# Patient Record
Sex: Female | Born: 1960 | Race: White | Hispanic: No | Marital: Single | Smoking: Current some day smoker
Health system: Southern US, Community
[De-identification: ages and names within clinical notes are randomized; demographics above are authoritative.]

## PROBLEM LIST (undated history)

## (undated) DIAGNOSIS — R200 Anesthesia of skin: Secondary | ICD-10-CM

## (undated) DIAGNOSIS — R350 Frequency of micturition: Secondary | ICD-10-CM

## (undated) DIAGNOSIS — M549 Dorsalgia, unspecified: Secondary | ICD-10-CM

## (undated) DIAGNOSIS — G8929 Other chronic pain: Secondary | ICD-10-CM

## (undated) DIAGNOSIS — M5430 Sciatica, unspecified side: Secondary | ICD-10-CM

## (undated) DIAGNOSIS — Z860100 Personal history of colon polyps, unspecified: Secondary | ICD-10-CM

## (undated) HISTORY — PX: FRACTURE SURGERY: SHX138

## (undated) HISTORY — PX: BACK SURGERY: SHX140

## (undated) SURGERY — COLONOSCOPY
Anesthesia: Moderate Sedation

---

## 2016-05-27 ENCOUNTER — Telehealth: Payer: Self-pay

## 2016-05-27 NOTE — Telephone Encounter (Signed)
[phone]  PATIENT RECEIVED LETTER TO SCHEDULE TCS

## 2016-05-28 ENCOUNTER — Telehealth: Payer: Self-pay

## 2016-06-02 NOTE — Telephone Encounter (Signed)
Gastroenterology Pre-Procedure Review  Request Date: 05/28/2016 Requesting Physician: Abran Richard, MD  PATIENT REVIEW QUESTIONS: The patient responded to the following health history questions as indicated:    1. Diabetes Melitis: no 2. Joint replacements in the past 12 months: no 3. Major health problems in the past 3 months: no 4. Has an artificial valve or MVP: no 5. Has a defibrillator: no 6. Has been advised in past to take antibiotics in advance of a procedure like teeth cleaning: no 7. Family history of colon cancer: no  8. Alcohol Use: no 9. History of sleep apnea: no     MEDICATIONS & ALLERGIES:    Patient reports the following regarding taking any blood thinners:   Plavix? no Aspirin? no Coumadin? no  Patient confirms/reports the following medications:  Current Outpatient Prescriptions  Medication Sig Dispense Refill  . HYDROcodone-acetaminophen (NORCO/VICODIN) 5-325 MG tablet Take 1 tablet by mouth every 6 (six) hours as needed for moderate pain.     No current facility-administered medications for this visit.     Patient confirms/reports the following allergies:  No Known Allergies  No orders of the defined types were placed in this encounter.   AUTHORIZATION INFORMATION Primary Insurance:   ID #:   Group #:  Pre-Cert / Auth required:  Pre-Cert / Auth #:   Secondary Insurance:   ID #:   Group #:  Pre-Cert / Auth required:  Pre-Cert / Auth #:   SCHEDULE INFORMATION: Procedure has been scheduled as follows:  Date: 06/19/2016             Time: 9:15 AM  Location: [facility] Short Stay  This Gastroenterology Pre-Precedure Review Form is being routed to the following provider(s): Barney Drain, MD

## 2016-06-04 ENCOUNTER — Other Ambulatory Visit (HOSPITAL_COMMUNITY): Payer: Self-pay | Admitting: Family

## 2016-06-04 DIAGNOSIS — Z1231 Encounter for screening mammogram for malignant neoplasm of breast: Secondary | ICD-10-CM

## 2016-06-05 NOTE — Telephone Encounter (Signed)
See separate triage.  

## 2016-06-05 NOTE — Telephone Encounter (Signed)
PREPOPIK-DRINK WATER TO KEEP URINE LIGHT YELLOW.  FULL LIQUIDS WITH BREAKFAST.  Full Liquid Diet A high-calorie, high-protein supplement should be used to meet your nutritional requirements when the full liquid diet is continued for more than 2 or 3 days. If this diet is to be used for an extended period of time (more than 7 days), a multivitamin should be considered.  Breads and Starches  Allowed: None are allowed  Avoid: Any others.    Potatoes/Pasta/Rice  Allowed: ANY ITEM AS A SOUP OR SMALL PLATE OF MASHED POTATOES OR SCRAMBLED EGGS.       Vegetables  Allowed: Strained tomato or vegetable juice. Vegetables pureed in soup.   Avoid: Any others.    Fruit  Allowed: Any strained fruit juices and fruit drinks. Include 1 serving of citrus or vitamin C-enriched fruit juice daily.   Avoid: Any others.  Meat and Meat Substitutes  Allowed: Egg  Avoid: Any meat, fish, or fowl. All cheese.  Milk  Allowed: Milk beverages, including milk shakes and instant breakfast mixes. Smooth yogurt.   Avoid: Any others. Avoid dairy products if not tolerated.    Soups and Combination Foods  Allowed: Broth, strained cream soups. Strained, broth-based soups.   Avoid: Any others.    Desserts and Sweets  Allowed: flavored gelatin, ice cream, sherbet, smooth pudding, junket, fruit ices, frozen ice pops, pudding pops, frozen fudge pops, chocolate syrup. Sugar, honey, jelly, syrup.   Avoid: Any others.  Fats and Oils  Allowed: Margarine, butter, cream, sour cream, oils.   Avoid: Any others.  Beverages  Allowed: All.   Avoid: None.  Condiments  Allowed: Iodized salt, pepper, spices, flavorings. Cocoa powder.   Avoid: Any others.    SAMPLE MEAL PLAN Breakfast   cup orange juice.   1 OR 2 EGGS  1 cup milk.   1 cup beverage (coffee or tea).   Cream or sugar, if desired.    Midmorning Snack  2 SCRAMBLED OR HARD BOILED EGG   Lunch  1 cup cream soup.     cup fruit juice.   1 cup milk.    cup custard.   1 cup beverage (coffee or tea).   Cream or sugar, if desired.    Midafternoon Snack  1 cup milk shake.  Dinner  1 cup cream soup.    cup fruit juice.   1 cup MILK    cup pudding.   1 cup beverage (coffee or tea).   Cream or sugar, if desired.  Evening Snack  1 cup supplement.  To increase calories, add sugar, cream, butter, or margarine if possible. Nutritional supplements will also increase the total calories.

## 2016-06-10 ENCOUNTER — Other Ambulatory Visit: Payer: Self-pay

## 2016-06-10 DIAGNOSIS — Z1211 Encounter for screening for malignant neoplasm of colon: Secondary | ICD-10-CM

## 2016-06-10 MED ORDER — PEG 3350-KCL-NA BICARB-NACL 420 G PO SOLR: 4000.0000 mL | ORAL | 0 refills | Status: DC

## 2016-06-10 NOTE — Telephone Encounter (Signed)
Prepopik not covered by insurance. Sending in the [place] and mailing instructions to pt.

## 2016-06-12 ENCOUNTER — Ambulatory Visit (HOSPITAL_COMMUNITY)
Admission: RE | Admit: 2016-06-12 | Discharge: 2016-06-12 | Disposition: A | Discharge status: Home / Self Care | Payer: Medicaid Other | Source: Ambulatory Visit | Attending: Family | Admitting: Family

## 2016-06-12 DIAGNOSIS — Z1231 Encounter for screening mammogram for malignant neoplasm of breast: Secondary | ICD-10-CM | POA: Diagnosis present

## 2016-06-16 NOTE — Telephone Encounter (Signed)
No PA is needed pre online system

## 2016-06-18 ENCOUNTER — Other Ambulatory Visit: Payer: Self-pay

## 2016-06-18 ENCOUNTER — Telehealth: Payer: Self-pay | Admitting: Gastroenterology

## 2016-06-18 DIAGNOSIS — Z1211 Encounter for screening for malignant neoplasm of colon: Secondary | ICD-10-CM

## 2016-06-18 NOTE — Telephone Encounter (Signed)
Kenney Houseman from [facility] called and asked if we could fax patient's Trilyte prescription to them. Patient is scheduled for a colonoscopy tomorrow.

## 2016-06-18 NOTE — Telephone Encounter (Signed)
I called the RX to the pharmacy since it had failed transmission.  Pt is aware.

## 2016-06-19 ENCOUNTER — Ambulatory Visit (HOSPITAL_COMMUNITY)
Admission: RE | Admit: 2016-06-19 | Discharge: 2016-06-19 | Disposition: A | Discharge status: Home / Self Care | Payer: Medicaid Other | Source: Ambulatory Visit | Attending: Gastroenterology | Admitting: Gastroenterology

## 2016-06-19 ENCOUNTER — Encounter (HOSPITAL_COMMUNITY)
Admission: RE | Disposition: A | Discharge status: Home / Self Care | Payer: Self-pay | Source: Ambulatory Visit | Attending: Gastroenterology

## 2016-06-19 ENCOUNTER — Encounter (HOSPITAL_COMMUNITY): Payer: Self-pay | Admitting: *Deleted

## 2016-06-19 DIAGNOSIS — D128 Benign neoplasm of rectum: Secondary | ICD-10-CM

## 2016-06-19 DIAGNOSIS — F17200 Nicotine dependence, unspecified, uncomplicated: Secondary | ICD-10-CM | POA: Insufficient documentation

## 2016-06-19 DIAGNOSIS — K621 Rectal polyp: Secondary | ICD-10-CM | POA: Insufficient documentation

## 2016-06-19 DIAGNOSIS — Z79899 Other long term (current) drug therapy: Secondary | ICD-10-CM

## 2016-06-19 DIAGNOSIS — K648 Other hemorrhoids: Secondary | ICD-10-CM | POA: Diagnosis not present

## 2016-06-19 DIAGNOSIS — Q438 Other specified congenital malformations of intestine: Secondary | ICD-10-CM | POA: Insufficient documentation

## 2016-06-19 DIAGNOSIS — Z1211 Encounter for screening for malignant neoplasm of colon: Secondary | ICD-10-CM | POA: Diagnosis present

## 2016-06-19 HISTORY — PX: COLONOSCOPY: SHX5424

## 2016-06-19 SURGERY — COLONOSCOPY
Anesthesia: Moderate Sedation

## 2016-06-19 MED ORDER — MEPERIDINE HCL 100 MG/ML IJ SOLN
INTRAMUSCULAR | Status: DC | PRN
  Administered 2016-06-19 (×2): 25 mg via INTRAVENOUS

## 2016-06-19 MED ORDER — SODIUM CHLORIDE 0.9 % IV SOLN
INTRAVENOUS | Status: DC
  Administered 2016-06-19: 09:00:00 via INTRAVENOUS

## 2016-06-19 MED ORDER — MIDAZOLAM HCL 5 MG/5ML IJ SOLN
INTRAMUSCULAR | Status: AC
  Filled 2016-06-19: qty 10

## 2016-06-19 MED ORDER — MIDAZOLAM HCL 5 MG/5ML IJ SOLN
INTRAMUSCULAR | Status: DC | PRN
  Administered 2016-06-19: 1 mg via INTRAVENOUS
  Administered 2016-06-19 (×2): 2 mg via INTRAVENOUS

## 2016-06-19 MED ORDER — MEPERIDINE HCL 100 MG/ML IJ SOLN
INTRAMUSCULAR | Status: DC
Start: 2016-06-19 — End: 2016-06-19
  Filled 2016-06-19: qty 2

## 2016-06-19 NOTE — H&P (Signed)
  Primary Care Physician:  Joyice Faster, FNP Primary Gastroenterologist:  Dr. Oneida Alar  Pre-Procedure History & Physical: HPI:  Dana Goodwin is a 55 y.o. female here for [place].  History reviewed. No pertinent past medical history.  Past Surgical History:  Procedure Laterality Date  . BACK SURGERY    . FRACTURE SURGERY Left    left leg has rod and pins    Prior to Admission medications   Medication Sig Start Date End Date Taking? Authorizing Provider  HYDROcodone-acetaminophen (NORCO/VICODIN) 5-325 MG tablet Take 1 tablet by mouth every 6 (six) hours as needed for moderate pain.   Yes Historical Provider, MD  polyethylene glycol-electrolytes (TRILYTE) 420 g solution Take 4,000 mLs by mouth as directed. 06/10/16  Yes Danie Binder, MD    Allergies as of 06/10/2016  . (No Known Allergies)    Family History  Problem Relation Age of Onset  . Heart attack Mother   . Prostate cancer Father   . Heart attack Brother   . Diabetes Brother     Social History   Social History  . Marital status: Single    Spouse name: N/A  . Number of children: N/A  . Years of education: N/A   Occupational History  . Not on file.   Social History Main Topics  . Smoking status: Current Every Day Smoker    Packs/day: 0.50    Years: 37.00  . Smokeless tobacco: Never Used  . Alcohol use No  . Drug use: No  . Sexual activity: Not on file   Other Topics Concern  . Not on file   Social History Narrative  . No narrative on file    Review of Systems: See HPI, otherwise negative ROS   Physical Exam: BP 121/67   Pulse (!) 58   Temp 98.3 F (36.8 C) (Oral)   Resp 20   Ht 5' 2.5" (1.588 m)   Wt 115 lb (52.2 kg)   SpO2 100%   BMI 20.70 kg/m  General:   Alert,  pleasant and cooperative in NAD Head:  Normocephalic and atraumatic. Neck:  Supple; Lungs:  Clear throughout to auscultation.    Heart:  Regular rate and rhythm. Abdomen:  Soft, nontender and nondistended.  Normal bowel sounds, without guarding, and without rebound.   Neurologic:  Alert and  oriented x4;  grossly normal neurologically.  Impression/Plan:     SCREENING  Plan:  1. TCS TODAY

## 2016-06-19 NOTE — Discharge Instructions (Signed)
You had 2 small polyps removed FROM YOUR RECTUM. You have internal hemorrhoids.   DRINK WATER TO KEEP YOUR URINE LIGHT YELLOW.  FOLLOW A HIGH FIBER DIET. AVOID ITEMS THAT CAUSE BLOATING & GAS. SEE INFO BELOW.  YOUR BIOPSY RESULTS WILL BE AVAILABLE IN MY CHART AFTER   SEP 19 AND MY OFFICE WILL CONTACT YOU IN 10-14 DAYS WITH YOUR RESULTS.   Next colonoscopy in 5-10 years.    Colonoscopy Care After Read the instructions outlined below and refer to this sheet in the next week. These discharge instructions provide you with general information on caring for yourself after you leave the hospital. While your treatment has been planned according to the most current medical practices available, unavoidable complications occasionally occur. If you have any problems or questions after discharge, call DR. Najee Manninen, [phone].  ACTIVITY  You may resume your regular activity, but move at a slower pace for the next 24 hours.   Take frequent rest periods for the next 24 hours.   Walking will help get rid of the air and reduce the bloated feeling in your belly (abdomen).   No driving for 24 hours (because of the medicine (anesthesia) used during the test).   You may shower.   Do not sign any important legal documents or operate any machinery for 24 hours (because of the anesthesia used during the test).    NUTRITION  Drink plenty of fluids.   You may resume your normal diet as instructed by your doctor.   Begin with a light meal and progress to your normal diet. Heavy or fried foods are harder to digest and may make you feel sick to your stomach (nauseated).   Avoid alcoholic beverages for 24 hours or as instructed.    MEDICATIONS  You may resume your normal medications.   WHAT YOU CAN EXPECT TODAY  Some feelings of bloating in the abdomen.   Passage of more gas than usual.   Spotting of blood in your stool or on the toilet paper  .  IF YOU HAD POLYPS REMOVED DURING THE  COLONOSCOPY:  Eat a soft diet IF YOU HAVE NAUSEA, BLOATING, ABDOMINAL PAIN, OR VOMITING.    FINDING OUT THE RESULTS OF YOUR TEST Not all test results are available during your visit. DR. Oneida Alar WILL CALL YOU WITHIN 14 DAYS OF YOUR PROCEDUE WITH YOUR RESULTS. Do not assume everything is normal if you have not heard from DR. Bernetta Sutley, CALL HER OFFICE AT [phone].  SEEK IMMEDIATE MEDICAL ATTENTION AND CALL THE OFFICE: [phone] IF:  You have more than a spotting of blood in your stool.   Your belly is swollen (abdominal distention).   You are nauseated or vomiting.   You have a temperature over 101F.   You have abdominal pain or discomfort that is severe or gets worse throughout the day.   High-Fiber Diet A high-fiber diet changes your normal diet to include more whole grains, legumes, fruits, and vegetables. Changes in the diet involve replacing refined carbohydrates with unrefined foods. The calorie level of the diet is essentially unchanged. The Dietary Reference Intake (recommended amount) for adult males is 38 grams per day. For adult females, it is 25 grams per day. Pregnant and lactating women should consume 28 grams of fiber per day. Fiber is the intact part of a plant that is not broken down during digestion. Functional fiber is fiber that has been isolated from the plant to provide a beneficial effect in the body. PURPOSE  Increase  stool bulk.   Ease and regulate bowel movements.   Lower cholesterol.   REDUCE RISK OF COLON CANCER  INDICATIONS THAT YOU NEED MORE FIBER  Constipation and hemorrhoids.   Uncomplicated diverticulosis (intestine condition) and irritable bowel syndrome.   Weight management.   As a protective measure against hardening of the arteries (atherosclerosis), diabetes, and cancer.   GUIDELINES FOR INCREASING FIBER IN THE DIET  Start adding fiber to the diet slowly. A gradual increase of about 5 more grams (2 slices of whole-wheat bread, 2  servings of most fruits or vegetables, or 1 bowl of high-fiber cereal) per day is best. Too rapid an increase in fiber may result in constipation, flatulence, and bloating.   Drink enough water and fluids to keep your urine clear or pale yellow. Water, juice, or caffeine-free drinks are recommended. Not drinking enough fluid may cause constipation.   Eat a variety of high-fiber foods rather than one type of fiber.   Try to increase your intake of fiber through using high-fiber foods rather than fiber pills or supplements that contain small amounts of fiber.   The goal is to change the types of food eaten. Do not supplement your present diet with high-fiber foods, but replace foods in your present diet.   INCLUDE A VARIETY OF FIBER SOURCES  Replace refined and processed grains with whole grains, canned fruits with fresh fruits, and incorporate other fiber sources. White rice, white breads, and most bakery goods contain little or no fiber.   Brown whole-grain rice, buckwheat oats, and many fruits and vegetables are all good sources of fiber. These include: broccoli, Brussels sprouts, cabbage, cauliflower, beets, sweet potatoes, white potatoes (skin on), carrots, tomatoes, eggplant, squash, berries, fresh fruits, and dried fruits.   Cereals appear to be the richest source of fiber. Cereal fiber is found in whole grains and bran. Bran is the fiber-rich outer coat of cereal grain, which is largely removed in refining. In whole-grain cereals, the bran remains. In breakfast cereals, the largest amount of fiber is found in those with "bran" in their names. The fiber content is sometimes indicated on the label.   You may need to include additional fruits and vegetables each day.   In baking, for 1 cup white flour, you may use the following substitutions:   1 cup whole-wheat flour minus 2 tablespoons.   1/2 cup white flour plus 1/2 cup whole-wheat flour.   Polyps, Colon  A polyp is extra tissue that  grows inside your body. Colon polyps grow in the large intestine. The large intestine, also called the colon, is part of your digestive system. It is a long, hollow tube at the end of your digestive tract where your body makes and stores stool. Most polyps are not dangerous. They are benign. This means they are not cancerous. But over time, some types of polyps can turn into cancer. Polyps that are smaller than a pea are usually not harmful. But larger polyps could someday become or may already be cancerous. To be safe, doctors remove all polyps and test them.   PREVENTION There is not one sure way to prevent polyps. You might be able to lower your risk of getting them if you:  Eat more fruits and vegetables and less fatty food.   Do not smoke.   Avoid alcohol.   Exercise every day.   Lose weight if you are overweight.   Eating more calcium and folate can also lower your risk of getting polyps. Some  foods that are rich in calcium are milk, cheese, and broccoli. Some foods that are rich in folate are chickpeas, kidney beans, and spinach.   Hemorrhoids Hemorrhoids are dilated (enlarged) veins around the rectum. Sometimes clots will form in the veins. This makes them swollen and painful. These are called thrombosed hemorrhoids. Causes of hemorrhoids include:  Constipation.   Straining to have a bowel movement.   HEAVY LIFTING  HOME CARE INSTRUCTIONS  Eat a well balanced diet and drink 6 to 8 glasses of water every day to avoid constipation. You may also use a bulk laxative.   Avoid straining to have bowel movements.   Keep anal area dry and clean.   Do not use a donut shaped pillow or sit on the toilet for long periods. This increases blood pooling and pain.   Move your bowels when your body has the urge; this will require less straining and will decrease pain and pressure.

## 2016-06-19 NOTE — Op Note (Signed)
[facility] Patient Name: Dana Goodwin Procedure Date: 06/19/2016 9:25 AM MRN: [id number] Date of Birth: [birth year] Attending MD: Barney Drain , MD CSN: [id number] Age: 55 Admit Type: Outpatient Procedure:                Colonoscopy WITH COLD FORCEPS POLYPECTOMY Indications:              Screening for colorectal malignant neoplasm Providers:                Barney Drain, MD, Janeece Riggers, RN, Isabella Stalling,                            Technician Referring MD:             Joyice Faster Medicines:                Meperidine 50 mg IV, Midazolam 5 mg IV Complications:            No immediate complications. Estimated Blood Loss:     Estimated blood loss was minimal. Procedure:                Pre-Anesthesia Assessment:                           - Prior to the procedure, a History and Physical                            was performed, and patient medications and                            allergies were reviewed. The patient's tolerance of                            previous anesthesia was also reviewed. The risks                            and benefits of the procedure and the sedation                            options and risks were discussed with the patient.                            All questions were answered, and informed consent                            was obtained. Prior Anticoagulants: The patient has                            taken no previous anticoagulant or antiplatelet                            agents. ASA Grade Assessment: I - A normal, healthy                            patient. After reviewing the risks and benefits,  the patient was deemed in satisfactory condition to                            undergo the procedure. After obtaining informed                            consent, the colonoscope was passed under direct                            vision. Throughout the procedure, the patient's                            blood pressure,  pulse, and oxygen saturations were                            monitored continuously. The [id number]                            scope was introduced through the anus and advanced                            to the the cecum, identified by appendiceal orifice                            and ileocecal valve. The ileocecal valve,                            appendiceal orifice, and rectum were photographed.                            The colonoscopy was somewhat difficult due to a                            tortuous colon. Successful completion of the                            procedure was aided by COLOWRAP. The patient                            tolerated the procedure well. The quality of the                            bowel preparation was excellent. Scope In: 9:47:13 AM Scope Out: 10:03:15 AM Scope Withdrawal Time: 0 hours 11 minutes 29 seconds  Total Procedure Duration: 0 hours 16 minutes 2 seconds  Findings:      The digital rectal exam was normal.      Two sessile polyps were found in the rectum. The polyps were 2 to 5 mm       in size. These polyps were removed with a cold biopsy forceps. Resection       and retrieval were complete.      The recto-sigmoid colon and sigmoid colon were mildly redundant. AND       COLOWRAP      Non-bleeding internal hemorrhoids were found. The  hemorrhoids were       moderate. Impression:               - Two 2 to 5 mm polyps in the rectum, removed with                            a cold biopsy forceps. .                           - Redundant colon.                           - Non-bleeding internal hemorrhoids. Moderate Sedation:      Moderate (conscious) sedation was administered by the endoscopy nurse       and supervised by the endoscopist. The following parameters were       monitored: oxygen saturation, heart rate, blood pressure, and response       to care. Total physician intraservice time was 34 minutes. Recommendation:           -  High fiber diet.                           - Continue present medications.                           - Await pathology results.                           - Repeat colonoscopy in 5-10 years for surveillance.                           - Patient has a contact number available for                            emergencies. The signs and symptoms of potential                            delayed complications were discussed with the                            patient. Return to normal activities tomorrow.                            Written discharge instructions were provided to the                            patient. Procedure Code(s):        --- Professional ---                           [phone], Colonoscopy, flexible; with biopsy, single                            or multiple                           99152, Moderate sedation services provided by the  same physician or other qualified health care                            professional performing the diagnostic or                            therapeutic service that the sedation supports,                            requiring the presence of an independent trained                            observer to assist in the monitoring of the                            patient's level of consciousness and physiological                            status; initial 15 minutes of intraservice time,                            patient age 77 years or older                           [phone], Moderate sedation services; each additional                            15 minutes intraservice time Diagnosis Code(s):        --- Professional ---                           Z12.11, Encounter for screening for malignant                            neoplasm of colon                           K62.1, Rectal polyp                           K64.8, Other hemorrhoids                           Q43.8, Other specified congenital malformations of                             intestine CPT copyright 2016 [employer]. All rights reserved. The codes documented in this report are preliminary and upon coder review may  be revised to meet current compliance requirements. Barney Drain, MD Barney Drain, MD 06/19/2016 10:19:05 AM This report has been signed electronically. Number of Addenda: 0

## 2016-06-22 ENCOUNTER — Telehealth: Payer: Self-pay | Admitting: Gastroenterology

## 2016-06-22 NOTE — Telephone Encounter (Signed)
Please call pt. She had HYPERPLASTIC POLYPS removed.    DRINK WATER TO KEEP YOUR URINE LIGHT YELLOW.  FOLLOW A HIGH FIBER DIET. AVOID ITEMS THAT CAUSE BLOATING & GAS.   Next colonoscopy in 10 years.

## 2016-06-23 NOTE — Telephone Encounter (Signed)
Pt called and was informed.  

## 2016-06-23 NOTE — Telephone Encounter (Signed)
Tried to call, VM not set up. Mailing a letter to call.

## 2016-06-23 NOTE — Telephone Encounter (Signed)
Reminder in epic °

## 2016-06-29 ENCOUNTER — Encounter (HOSPITAL_COMMUNITY): Payer: Self-pay | Admitting: Gastroenterology

## 2016-07-20 ENCOUNTER — Ambulatory Visit: Payer: Medicaid Other | Admitting: Orthopedic Surgery

## 2016-07-21 ENCOUNTER — Ambulatory Visit (INDEPENDENT_AMBULATORY_CARE_PROVIDER_SITE_OTHER): Payer: Medicaid Other | Admitting: Orthopedic Surgery

## 2016-07-21 ENCOUNTER — Encounter: Payer: Self-pay | Admitting: Orthopedic Surgery

## 2016-07-21 ENCOUNTER — Ambulatory Visit (INDEPENDENT_AMBULATORY_CARE_PROVIDER_SITE_OTHER): Payer: Medicaid Other

## 2016-07-21 VITALS — BP 145/94 | HR 67 | Ht 63.0 in | Wt 115.0 lb

## 2016-07-21 DIAGNOSIS — M5442 Lumbago with sciatica, left side: Secondary | ICD-10-CM

## 2016-07-21 DIAGNOSIS — M5126 Other intervertebral disc displacement, lumbar region: Secondary | ICD-10-CM | POA: Diagnosis not present

## 2016-07-21 DIAGNOSIS — G8929 Other chronic pain: Secondary | ICD-10-CM

## 2016-07-21 MED ORDER — CYCLOBENZAPRINE HCL 10 MG PO TABS: 10.0000 mg | ORAL_TABLET | Freq: Two times a day (BID) | ORAL | 1 refills | Status: DC | PRN

## 2016-07-21 MED ORDER — METHYLPREDNISOLONE ACETATE 40 MG/ML IJ SUSP
40.0000 mg | Freq: Once | INTRAMUSCULAR | Status: AC
  Administered 2016-07-21: 40 mg via INTRAMUSCULAR

## 2016-07-21 MED ORDER — GABAPENTIN 100 MG PO CAPS: 100.0000 mg | ORAL_CAPSULE | Freq: Three times a day (TID) | ORAL | 2 refills | Status: DC

## 2016-07-21 NOTE — Patient Instructions (Addendum)
MRI  Continue motrin   Meds ordered this encounter  Medications  . cyclobenzaprine (FLEXERIL) 10 MG tablet    Sig: Take 1 tablet (10 mg total) by mouth 2 (two) times daily as needed for muscle spasms.    Dispense:  40 tablet    Refill:  1  . gabapentin (NEURONTIN) 100 MG capsule    Sig: Take 1 capsule (100 mg total) by mouth 3 (three) times daily.    Dispense:  90 capsule    Refill:  2

## 2016-07-21 NOTE — Progress Notes (Signed)
Chief Complaint  Patient presents with  . Back Pain    LOW BACK PAIN   HPI   55 year old female status post 2 laminectomies back in the 90s by Dr. Primitivo Gauze presents with recurrent lower back pain for the last 3 months worsening over the last month. Presents with constant sharp aching lower back pain rated 8 out of 10 associated with locking and stiffness of the lumbar spine and numbness and tingling in her left leg and weakness in the left foot  She did try Motrin and some hydrocodone without relief her symptoms are worse when sitting.  Review of Systems  Musculoskeletal:       Joint pain limb pain stiffness joints swollen joints back pain  Neurological:       Numbness and tingling and burning pain in the left leg  All other systems reviewed and are negative.   Medical history of pneumonia but no medical problems of hypertension liver disease heart disease  Past Surgical History:  Procedure Laterality Date  . BACK SURGERY    . COLONOSCOPY N/A 06/19/2016   Procedure: COLONOSCOPY;  Surgeon: Danie Binder, MD;  [facility];  Service: Endoscopy;  Laterality: N/A;  9:15 Am  . FRACTURE SURGERY Left    left leg has rod and pins   Family History  Problem Relation Age of Onset  . Heart attack Mother   . Prostate cancer Father   . Heart attack Brother   . Diabetes Brother    Social History  Substance Use Topics  . Smoking status: Current Every Day Smoker    Packs/day: 0.50    Years: 37.00  . Smokeless tobacco: Never Used  . Alcohol use No   No outpatient prescriptions have been marked as taking for the 07/21/16 encounter (Office Visit) with Carole Civil, MD.    BP (!) 145/94   Pulse 67   Ht 5\' 3"  (1.6 m)   Wt 115 lb (52.2 kg)   BMI 20.37 kg/m   Physical Exam  Constitutional: She is oriented to person, place, and time. She appears well-developed and well-nourished. No distress.  Cardiovascular: Normal rate and intact distal pulses.   Neurological: She is  alert and oriented to person, place, and time. She has normal reflexes. She exhibits normal muscle tone. Coordination normal.  Skin: Skin is warm and dry. No rash noted. She is not diaphoretic. No erythema. No pallor.  Psychiatric: She has a normal mood and affect. Her behavior is normal. Judgment and thought content normal.    Ortho Exam Right lower extremity inspection is normal palpation no tenderness hip knee and ankle full range of motion all joints stable muscle tone and strength normal skin normal pulses good no edema normal sensation normal reflexes negative straight leg raise on the right  Tenderness none lumbar spine previous incision well healed no tenderness no erythema tenderness at L4-5 L5-S1 left side and left buttock. She has a positive femoral nerve stretch test a positive straight leg raise on the left at 50 she has weakness of the toe extensors as well as the foot dorsiflexor. No instability is noted in the leg skin is otherwise normal she has an old rod in the tibia with a cyst formed previously worked up without abnormality. Pulses normal and sensation is altered in the L4 region    ASSESSMENT: My personal interpretation of the images:  Plain films show degenerative disc disease at L4-5 and L5-S1 as well as facet joints consistent with previous  surgery    PLAN MRI Intramuscular 40 mg Depo-Medrol Muscle relaxer and gabapentin Follow-up after MRI  continue ibuprofen   Arther Abbott, MD 07/21/2016 9:47 AM  .meds

## 2016-07-21 NOTE — Addendum Note (Signed)
Addended by: Baldomero Lamy B on: 07/21/2016 11:42 AM   Modules accepted: Orders

## 2016-07-21 NOTE — Addendum Note (Signed)
Addended by: Baldomero Lamy B on: 07/21/2016 10:31 AM   Modules accepted: Orders

## 2016-07-31 ENCOUNTER — Ambulatory Visit (HOSPITAL_COMMUNITY)
Admission: RE | Admit: 2016-07-31 | Discharge: 2016-07-31 | Disposition: A | Discharge status: Home / Self Care | Payer: Medicaid Other | Source: Ambulatory Visit | Attending: Orthopedic Surgery | Admitting: Orthopedic Surgery

## 2016-07-31 DIAGNOSIS — M5126 Other intervertebral disc displacement, lumbar region: Secondary | ICD-10-CM | POA: Diagnosis present

## 2016-07-31 DIAGNOSIS — M48061 Spinal stenosis, lumbar region without neurogenic claudication: Secondary | ICD-10-CM | POA: Diagnosis not present

## 2016-07-31 DIAGNOSIS — M5136 Other intervertebral disc degeneration, lumbar region: Secondary | ICD-10-CM | POA: Diagnosis not present

## 2016-08-12 ENCOUNTER — Ambulatory Visit (INDEPENDENT_AMBULATORY_CARE_PROVIDER_SITE_OTHER): Payer: Medicaid Other | Admitting: Orthopedic Surgery

## 2016-08-12 ENCOUNTER — Encounter: Payer: Self-pay | Admitting: Orthopedic Surgery

## 2016-08-12 DIAGNOSIS — M5126 Other intervertebral disc displacement, lumbar region: Secondary | ICD-10-CM | POA: Diagnosis not present

## 2016-08-12 MED ORDER — HYDROCODONE-ACETAMINOPHEN 5-325 MG PO TABS: 1.0000 | ORAL_TABLET | Freq: Three times a day (TID) | ORAL | 0 refills | Status: DC | PRN

## 2016-08-12 NOTE — Progress Notes (Signed)
Chief Complaint  Patient presents with  . Follow-up    MRI REVIEW L SPINE   HPI Chief Complaint  Patient presents with  . Back Pain      LOW BACK PAIN    HPI As taken on the last visit   55 year old female status post 2 laminectomies back in the 90s by Dr. Primitivo Gauze presents with recurrent lower back pain for the last 3 months worsening over the last month. Presents with constant sharp aching lower back pain rated 8 out of 10 associated with locking and stiffness of the lumbar spine and numbness and tingling in her left leg and weakness in the left foot   She did try Motrin and some hydrocodone without relief her symptoms are worse when sitting.  Review of Systems  Musculoskeletal:       Joint pain limb pain stiffness joints swollen joints back pain  Neurological:       Numbness and tingling and burning pain in the left leg  All other systems reviewed and are negative.   No past medical history on file.  Past Surgical History:  Procedure Laterality Date  . BACK SURGERY    . COLONOSCOPY N/A 06/19/2016   Procedure: COLONOSCOPY;  Surgeon: Danie Binder, MD;  [facility];  Service: Endoscopy;  Laterality: N/A;  9:15 Am  . FRACTURE SURGERY Left    left leg has rod and pins   Family History  Problem Relation Age of Onset  . Heart attack Mother   . Prostate cancer Father   . Heart attack Brother   . Diabetes Brother    Social History  Substance Use Topics  . Smoking status: Current Every Day Smoker    Packs/day: 0.50    Years: 37.00  . Smokeless tobacco: Never Used  . Alcohol use No   Current Meds  Medication Sig  . cyclobenzaprine (FLEXERIL) 10 MG tablet Take 1 tablet (10 mg total) by mouth 2 (two) times daily as needed for muscle spasms.  Marland Kitchen gabapentin (NEURONTIN) 100 MG capsule Take 1 capsule (100 mg total) by mouth 3 (three) times daily.    There were no vitals taken for this visit.  Physical Exam  Ortho Exam   ASSESSMENT: My personal  interpretation of the images:  L3-4 disc protrusion with extruded fragment and L4 nerve root impingement  Previous disc changes consistent with laminectomy at L4-5  Meds ordered this encounter  Medications  . HYDROcodone-acetaminophen (NORCO/VICODIN) 5-325 MG tablet    Sig: Take 1 tablet by mouth every 8 (eight) hours as needed for moderate pain.    Dispense:  90 tablet    Refill:  0     PLAN Referral to neurosurgery     Arther Abbott, MD 08/12/2016 11:00 AM  .meds

## 2016-08-17 ENCOUNTER — Telehealth: Payer: Self-pay | Admitting: Orthopedic Surgery

## 2016-08-17 ENCOUNTER — Other Ambulatory Visit: Payer: Self-pay | Admitting: *Deleted

## 2016-08-17 MED ORDER — HYDROCODONE-ACETAMINOPHEN 5-325 MG PO TABS: 1.0000 | ORAL_TABLET | Freq: Three times a day (TID) | ORAL | 0 refills | Status: DC | PRN

## 2016-08-17 NOTE — Telephone Encounter (Signed)
Called back / Relayed to patient.

## 2016-08-17 NOTE — Telephone Encounter (Signed)
Advise new script available for pick up, medicaid will only pay for 14 day supply per month

## 2016-08-17 NOTE — Telephone Encounter (Signed)
Patient called to check status of prior authorization of Hydrocodone prescription, which patient picked up as noted 08/12/16; states [facility] awaiting pre-authorization required for [facility]; said otherwise patient will need to pay for the medication.

## 2016-08-25 ENCOUNTER — Other Ambulatory Visit: Payer: Self-pay | Admitting: Orthopedic Surgery

## 2016-08-25 DIAGNOSIS — M5126 Other intervertebral disc displacement, lumbar region: Secondary | ICD-10-CM

## 2016-12-10 ENCOUNTER — Other Ambulatory Visit: Payer: Self-pay | Admitting: Neurosurgery

## 2016-12-22 NOTE — Pre-Procedure Instructions (Signed)
Dana Goodwin  12/22/2016      [place], [place] - [address] [place] [address] Phone: [phone] Fax: [phone]    Your procedure is scheduled on Wed, Mar 28 @ 8:00 AM  Report to [facility] Admitting at 6:00 AM  Call this number if you have problems the morning of surgery:  [phone]   Remember:  Do not eat food or drink liquids after midnight.  Take these medicines the morning of surgery with A SIP OF WATER Pain Pill(if needed)              No Goody's,BC's,Aleve,Advil,Motrin,Ibuprofen,Fish Oil,or any Herbal Medications.    Do not wear jewelry, make-up or nail polish.  Do not wear lotions, powders, perfumes, or deoderant.  Do not shave 48 hours prior to surgery.   Do not bring valuables to the hospital.  [facility] is not responsible for any belongings or valuables.  Contacts, dentures or bridgework may not be worn into surgery.  Leave your suitcase in the car.  After surgery it may be brought to your room.  For patients admitted to the hospital, discharge time will be determined by your treatment team.  Patients discharged the day of surgery will not be allowed to drive home.   [facility]  Before surgery, you can play an important role.  Because skin is not sterile, your skin needs to be as free of germs as possible.  You can reduce the number of germs on you skin by washing with CHG (chlorahexidine gluconate) soap before surgery.  CHG is an antiseptic cleaner which kills germs and bonds with the skin to continue killing germs even after washing.  Please DO NOT use if you have an allergy to CHG or antibacterial soaps.  If your skin becomes reddened/irritated stop using the CHG and inform your nurse when you arrive at Short Stay.  Do not shave (including legs and underarms) for at least 48 hours prior to the first CHG shower.  You may shave your  face.  Please follow these instructions carefully:   1.  Shower with CHG Soap the night before surgery and the                                morning of Surgery.  2.  If you choose to wash your hair, wash your hair first as usual with your       normal shampoo.  3.  After you shampoo, rinse your hair and body thoroughly to remove the                      Shampoo.  4.  Use CHG as you would any other liquid soap.  You can apply chg directly       to the skin and wash gently with scrungie or a clean washcloth.  5.  Apply the CHG Soap to your body ONLY FROM THE NECK DOWN.        Do not use on open wounds or open sores.  Avoid contact with your eyes,       ears, mouth and genitals (private parts).  Wash genitals (private parts)       with your normal soap.  6.  Wash thoroughly, paying special attention to the area where your surgery  will be performed.  7.  Thoroughly rinse your body with warm water from the neck down.  8.  DO NOT shower/wash with your normal soap after using and rinsing off       the CHG Soap.  9.  Pat yourself dry with a clean towel.            10.  Wear clean pajamas.            11.  Place clean sheets on your bed the night of your first shower and do not        sleep with pets.  Day of Surgery  Do not apply any lotions/deoderants the morning of surgery.  Please wear clean clothes to the hospital/surgery center.    Please read over the following fact sheets that you were given. Pain Booklet, Coughing and Deep Breathing, MRSA Information and Surgical Site Infection Prevention

## 2016-12-23 ENCOUNTER — Encounter (HOSPITAL_COMMUNITY)
Admission: RE | Admit: 2016-12-23 | Discharge: 2016-12-23 | Disposition: A | Discharge status: Home / Self Care | Payer: Medicaid Other | Source: Ambulatory Visit | Attending: Neurosurgery | Admitting: Neurosurgery

## 2016-12-23 ENCOUNTER — Encounter (HOSPITAL_COMMUNITY): Payer: Self-pay

## 2016-12-23 DIAGNOSIS — M5126 Other intervertebral disc displacement, lumbar region: Secondary | ICD-10-CM | POA: Insufficient documentation

## 2016-12-23 DIAGNOSIS — Z01812 Encounter for preprocedural laboratory examination: Secondary | ICD-10-CM | POA: Diagnosis not present

## 2016-12-23 HISTORY — DX: Personal history of colon polyps, unspecified: Z86.0100

## 2016-12-23 HISTORY — DX: Personal history of colonic polyps: Z86.010

## 2016-12-23 HISTORY — DX: Anesthesia of skin: R20.0

## 2016-12-23 HISTORY — DX: Frequency of micturition: R35.0

## 2016-12-23 LAB — CBC WITH DIFFERENTIAL/PLATELET
Basophils Absolute: 0.1 10*3/uL (ref 0.0–0.1)
Basophils Relative: 1 %
Eosinophils Absolute: 0.2 10*3/uL (ref 0.0–0.7)
Eosinophils Relative: 5 %
HCT: 42.6 % (ref 36.0–46.0)
Hemoglobin: 14.3 g/dL (ref 12.0–15.0)
Lymphocytes Relative: 41 %
Lymphs Abs: 1.8 10*3/uL (ref 0.7–4.0)
MCH: 30.4 pg (ref 26.0–34.0)
MCHC: 33.6 g/dL (ref 30.0–36.0)
MCV: 90.6 fL (ref 78.0–100.0)
Monocytes Absolute: 0.4 10*3/uL (ref 0.1–1.0)
Monocytes Relative: 9 %
Neutro Abs: 1.9 10*3/uL (ref 1.7–7.7)
Neutrophils Relative %: 44 %
Platelets: 265 10*3/uL (ref 150–400)
RBC: 4.7 MIL/uL (ref 3.87–5.11)
RDW: 13.3 % (ref 11.5–15.5)
WBC: 4.5 10*3/uL (ref 4.0–10.5)

## 2016-12-23 LAB — SURGICAL PCR SCREEN
MRSA, PCR: NEGATIVE
Staphylococcus aureus: NEGATIVE

## 2016-12-23 LAB — ABO/RH: ABO/RH(D): O POS

## 2016-12-23 LAB — TYPE AND SCREEN
ABO/RH(D): O POS
Antibody Screen: NEGATIVE

## 2016-12-23 MED ORDER — CHLORHEXIDINE GLUCONATE CLOTH 2 % EX PADS: 6.0000 | MEDICATED_PAD | Freq: Once | CUTANEOUS | Status: DC

## 2016-12-23 NOTE — Progress Notes (Addendum)
Medical Md is Dr.Hunter with [facility]  Cardiologist denies  Echo denies  Stress test denies  Heart cath denies  EKG denies in past yr  CXR denies in past yr

## 2016-12-30 ENCOUNTER — Ambulatory Visit (HOSPITAL_COMMUNITY): Payer: Medicaid Other | Admitting: Anesthesiology

## 2016-12-30 ENCOUNTER — Observation Stay (HOSPITAL_COMMUNITY)
Admission: RE | Admit: 2016-12-30 | Discharge: 2016-12-30 | Disposition: A | Discharge status: Home / Self Care | Payer: Medicaid Other | Source: Ambulatory Visit | Attending: Neurosurgery | Admitting: Neurosurgery

## 2016-12-30 ENCOUNTER — Encounter (HOSPITAL_COMMUNITY): Payer: Self-pay | Admitting: Urology

## 2016-12-30 ENCOUNTER — Ambulatory Visit (HOSPITAL_COMMUNITY): Payer: Medicaid Other

## 2016-12-30 ENCOUNTER — Encounter (HOSPITAL_COMMUNITY)
Admission: RE | Disposition: A | Discharge status: Home / Self Care | Payer: Self-pay | Source: Ambulatory Visit | Attending: Neurosurgery

## 2016-12-30 DIAGNOSIS — M5126 Other intervertebral disc displacement, lumbar region: Secondary | ICD-10-CM | POA: Diagnosis present

## 2016-12-30 DIAGNOSIS — Z86010 Personal history of colonic polyps: Secondary | ICD-10-CM | POA: Diagnosis not present

## 2016-12-30 DIAGNOSIS — Z79899 Other long term (current) drug therapy: Secondary | ICD-10-CM | POA: Insufficient documentation

## 2016-12-30 DIAGNOSIS — Z8249 Family history of ischemic heart disease and other diseases of the circulatory system: Secondary | ICD-10-CM | POA: Insufficient documentation

## 2016-12-30 DIAGNOSIS — F17200 Nicotine dependence, unspecified, uncomplicated: Secondary | ICD-10-CM | POA: Diagnosis not present

## 2016-12-30 DIAGNOSIS — Z8042 Family history of malignant neoplasm of prostate: Secondary | ICD-10-CM | POA: Diagnosis not present

## 2016-12-30 DIAGNOSIS — R350 Frequency of micturition: Secondary | ICD-10-CM | POA: Insufficient documentation

## 2016-12-30 DIAGNOSIS — M5116 Intervertebral disc disorders with radiculopathy, lumbar region: Principal | ICD-10-CM | POA: Insufficient documentation

## 2016-12-30 DIAGNOSIS — Z833 Family history of diabetes mellitus: Secondary | ICD-10-CM | POA: Diagnosis not present

## 2016-12-30 DIAGNOSIS — M79605 Pain in left leg: Secondary | ICD-10-CM | POA: Diagnosis not present

## 2016-12-30 DIAGNOSIS — Z419 Encounter for procedure for purposes other than remedying health state, unspecified: Secondary | ICD-10-CM

## 2016-12-30 HISTORY — PX: LUMBAR LAMINECTOMY/DECOMPRESSION MICRODISCECTOMY: SHX5026

## 2016-12-30 SURGERY — LUMBAR LAMINECTOMY/DECOMPRESSION MICRODISCECTOMY 1 LEVEL
Anesthesia: General | Site: Spine Lumbar | Laterality: Left

## 2016-12-30 MED ORDER — HYDROMORPHONE HCL 1 MG/ML IJ SOLN
INTRAMUSCULAR | Status: AC
  Filled 2016-12-30: qty 0.5

## 2016-12-30 MED ORDER — BUPIVACAINE HCL (PF) 0.25 % IJ SOLN
INTRAMUSCULAR | Status: AC
  Filled 2016-12-30: qty 30

## 2016-12-30 MED ORDER — PROPOFOL 10 MG/ML IV BOLUS
INTRAVENOUS | Status: DC | PRN
  Administered 2016-12-30: 140 mg via INTRAVENOUS

## 2016-12-30 MED ORDER — HYDROCODONE-ACETAMINOPHEN 5-325 MG PO TABS: 1.0000 | ORAL_TABLET | Freq: Three times a day (TID) | ORAL | 0 refills | Status: DC | PRN

## 2016-12-30 MED ORDER — MENTHOL 3 MG MT LOZG: 1.0000 | LOZENGE | OROMUCOSAL | Status: DC | PRN

## 2016-12-30 MED ORDER — THROMBIN 5000 UNITS EX SOLR
CUTANEOUS | Status: DC | PRN
  Administered 2016-12-30: 5000 [IU] via TOPICAL

## 2016-12-30 MED ORDER — ONDANSETRON HCL 4 MG/2ML IJ SOLN
INTRAMUSCULAR | Status: DC | PRN
  Administered 2016-12-30: 4 mg via INTRAVENOUS

## 2016-12-30 MED ORDER — DEXAMETHASONE SODIUM PHOSPHATE 10 MG/ML IJ SOLN
10.0000 mg | INTRAMUSCULAR | Status: AC
  Administered 2016-12-30: 8 mg via INTRAVENOUS
  Filled 2016-12-30: qty 1

## 2016-12-30 MED ORDER — SODIUM CHLORIDE 0.9 % IV SOLN: 250.0000 mL | INTRAVENOUS | Status: DC

## 2016-12-30 MED ORDER — SUGAMMADEX SODIUM 200 MG/2ML IV SOLN
INTRAVENOUS | Status: AC
  Filled 2016-12-30: qty 2

## 2016-12-30 MED ORDER — ROCURONIUM BROMIDE 100 MG/10ML IV SOLN
INTRAVENOUS | Status: DC | PRN
  Administered 2016-12-30: 40 mg via INTRAVENOUS

## 2016-12-30 MED ORDER — BACITRACIN 50000 UNITS IM SOLR
INTRAMUSCULAR | Status: DC | PRN
  Administered 2016-12-30: 09:00:00

## 2016-12-30 MED ORDER — KETOROLAC TROMETHAMINE 15 MG/ML IJ SOLN
30.0000 mg | Freq: Four times a day (QID) | INTRAMUSCULAR | Status: DC
  Administered 2016-12-30: 30 mg via INTRAVENOUS
  Filled 2016-12-30: qty 2

## 2016-12-30 MED ORDER — BUPIVACAINE HCL (PF) 0.25 % IJ SOLN
INTRAMUSCULAR | Status: DC | PRN
  Administered 2016-12-30: 20 mL

## 2016-12-30 MED ORDER — ONDANSETRON HCL 4 MG/2ML IJ SOLN: 4.0000 mg | Freq: Four times a day (QID) | INTRAMUSCULAR | Status: DC | PRN

## 2016-12-30 MED ORDER — SODIUM CHLORIDE 0.9% FLUSH: 3.0000 mL | INTRAVENOUS | Status: DC | PRN

## 2016-12-30 MED ORDER — ONDANSETRON HCL 4 MG PO TABS: 4.0000 mg | ORAL_TABLET | Freq: Four times a day (QID) | ORAL | Status: DC | PRN

## 2016-12-30 MED ORDER — ROCURONIUM BROMIDE 50 MG/5ML IV SOSY
PREFILLED_SYRINGE | INTRAVENOUS | Status: AC
  Filled 2016-12-30: qty 5

## 2016-12-30 MED ORDER — FENTANYL CITRATE (PF) 250 MCG/5ML IJ SOLN
INTRAMUSCULAR | Status: AC
  Filled 2016-12-30: qty 10

## 2016-12-30 MED ORDER — PHENOL 1.4 % MT LIQD: 1.0000 | OROMUCOSAL | Status: DC | PRN

## 2016-12-30 MED ORDER — HYDROCODONE-ACETAMINOPHEN 5-325 MG PO TABS
1.0000 | ORAL_TABLET | ORAL | Status: DC | PRN
  Administered 2016-12-30: 2 via ORAL
  Filled 2016-12-30: qty 2

## 2016-12-30 MED ORDER — EPHEDRINE SULFATE 50 MG/ML IJ SOLN
INTRAMUSCULAR | Status: DC | PRN
  Administered 2016-12-30: 5 mg via INTRAVENOUS

## 2016-12-30 MED ORDER — CEFAZOLIN IN D5W 1 GM/50ML IV SOLN: 1.0000 g | Freq: Three times a day (TID) | INTRAVENOUS | Status: DC

## 2016-12-30 MED ORDER — KETOROLAC TROMETHAMINE 30 MG/ML IJ SOLN
INTRAMUSCULAR | Status: DC | PRN
  Administered 2016-12-30: 30 mg via INTRAVENOUS

## 2016-12-30 MED ORDER — LIDOCAINE 2% (20 MG/ML) 5 ML SYRINGE
INTRAMUSCULAR | Status: AC
  Filled 2016-12-30: qty 5

## 2016-12-30 MED ORDER — HEMOSTATIC AGENTS (NO CHARGE) OPTIME
TOPICAL | Status: DC | PRN
  Administered 2016-12-30: 1 via TOPICAL

## 2016-12-30 MED ORDER — HYDROMORPHONE HCL 1 MG/ML IJ SOLN: 0.5000 mg | INTRAMUSCULAR | Status: DC | PRN

## 2016-12-30 MED ORDER — HYDROMORPHONE HCL 1 MG/ML IJ SOLN
0.2500 mg | INTRAMUSCULAR | Status: DC | PRN
  Administered 2016-12-30 (×3): 0.5 mg via INTRAVENOUS

## 2016-12-30 MED ORDER — SUGAMMADEX SODIUM 200 MG/2ML IV SOLN
INTRAVENOUS | Status: DC | PRN
  Administered 2016-12-30: 200 mg via INTRAVENOUS

## 2016-12-30 MED ORDER — SODIUM CHLORIDE 0.9% FLUSH: 3.0000 mL | Freq: Two times a day (BID) | INTRAVENOUS | Status: DC

## 2016-12-30 MED ORDER — CYCLOBENZAPRINE HCL 10 MG PO TABS: 10.0000 mg | ORAL_TABLET | Freq: Three times a day (TID) | ORAL | Status: DC | PRN

## 2016-12-30 MED ORDER — LIDOCAINE HCL (CARDIAC) 20 MG/ML IV SOLN
INTRAVENOUS | Status: DC | PRN
  Administered 2016-12-30: 60 mg via INTRATRACHEAL

## 2016-12-30 MED ORDER — CEFAZOLIN SODIUM-DEXTROSE 2-4 GM/100ML-% IV SOLN
2.0000 g | INTRAVENOUS | Status: AC
  Administered 2016-12-30: 2 g via INTRAVENOUS
  Filled 2016-12-30: qty 100

## 2016-12-30 MED ORDER — THROMBIN 5000 UNITS EX SOLR
CUTANEOUS | Status: AC
  Filled 2016-12-30: qty 10000

## 2016-12-30 MED ORDER — MIDAZOLAM HCL 2 MG/2ML IJ SOLN
INTRAMUSCULAR | Status: AC
  Filled 2016-12-30: qty 2

## 2016-12-30 MED ORDER — 0.9 % SODIUM CHLORIDE (POUR BTL) OPTIME
TOPICAL | Status: DC | PRN
  Administered 2016-12-30: 1000 mL

## 2016-12-30 MED ORDER — FENTANYL CITRATE (PF) 100 MCG/2ML IJ SOLN
INTRAMUSCULAR | Status: DC | PRN
  Administered 2016-12-30: 100 ug via INTRAVENOUS
  Administered 2016-12-30: 50 ug via INTRAVENOUS

## 2016-12-30 MED ORDER — CYCLOBENZAPRINE HCL 10 MG PO TABS: 10.0000 mg | ORAL_TABLET | Freq: Three times a day (TID) | ORAL | 0 refills | Status: DC | PRN

## 2016-12-30 MED ORDER — LACTATED RINGERS IV SOLN
INTRAVENOUS | Status: DC
  Administered 2016-12-30 (×2): via INTRAVENOUS

## 2016-12-30 MED ORDER — EPHEDRINE 5 MG/ML INJ
INTRAVENOUS | Status: AC
  Filled 2016-12-30: qty 10

## 2016-12-30 MED ORDER — MIDAZOLAM HCL 5 MG/5ML IJ SOLN
INTRAMUSCULAR | Status: DC | PRN
  Administered 2016-12-30: 2 mg via INTRAVENOUS

## 2016-12-30 MED ORDER — PROPOFOL 10 MG/ML IV BOLUS
INTRAVENOUS | Status: AC
  Filled 2016-12-30: qty 20

## 2016-12-30 MED ORDER — PROMETHAZINE HCL 25 MG/ML IJ SOLN: 6.2500 mg | INTRAMUSCULAR | Status: DC | PRN

## 2016-12-30 MED ORDER — ONDANSETRON HCL 4 MG/2ML IJ SOLN
INTRAMUSCULAR | Status: AC
  Filled 2016-12-30: qty 2

## 2016-12-30 SURGICAL SUPPLY — 46 items
BAG DECANTER FOR FLEXI CONT (MISCELLANEOUS) ×2 IMPLANT
BENZOIN TINCTURE PRP APPL 2/3 (GAUZE/BANDAGES/DRESSINGS) ×2 IMPLANT
BUR CUTTER 7.0 ROUND (BURR) ×2 IMPLANT
CANISTER SUCT 3000ML PPV (MISCELLANEOUS) ×2 IMPLANT
CARTRIDGE OIL MAESTRO DRILL (MISCELLANEOUS) ×1 IMPLANT
DERMABOND ADVANCED (GAUZE/BANDAGES/DRESSINGS) ×1
DERMABOND ADVANCED .7 DNX12 (GAUZE/BANDAGES/DRESSINGS) ×1 IMPLANT
DIFFUSER DRILL AIR PNEUMATIC (MISCELLANEOUS) ×2 IMPLANT
DRAPE HALF SHEET 40X57 (DRAPES) ×2 IMPLANT
DRAPE LAPAROTOMY 100X72X124 (DRAPES) ×2 IMPLANT
DRAPE MICROSCOPE LEICA (MISCELLANEOUS) ×2 IMPLANT
DRAPE POUCH INSTRU U-SHP 10X18 (DRAPES) ×2 IMPLANT
DRAPE SURG 17X23 STRL (DRAPES) ×4 IMPLANT
DRSG OPSITE POSTOP 4X6 (GAUZE/BANDAGES/DRESSINGS) ×2 IMPLANT
DURAPREP 26ML APPLICATOR (WOUND CARE) ×2 IMPLANT
ELECT REM PT RETURN 9FT ADLT (ELECTROSURGICAL) ×2
ELECTRODE REM PT RTRN 9FT ADLT (ELECTROSURGICAL) ×1 IMPLANT
GAUZE SPONGE 4X4 12PLY STRL (GAUZE/BANDAGES/DRESSINGS) IMPLANT
GAUZE SPONGE 4X4 16PLY XRAY LF (GAUZE/BANDAGES/DRESSINGS) IMPLANT
GLOVE BIOGEL PI IND STRL 7.0 (GLOVE) ×2 IMPLANT
GLOVE BIOGEL PI IND STRL 7.5 (GLOVE) ×1 IMPLANT
GLOVE BIOGEL PI INDICATOR 7.0 (GLOVE) ×2
GLOVE BIOGEL PI INDICATOR 7.5 (GLOVE) ×1
GLOVE ECLIPSE 9.0 STRL (GLOVE) ×2 IMPLANT
GLOVE SS N UNI LF 6.5 STRL (GLOVE) ×6 IMPLANT
GOWN STRL REUS W/ TWL LRG LVL3 (GOWN DISPOSABLE) ×1 IMPLANT
GOWN STRL REUS W/ TWL XL LVL3 (GOWN DISPOSABLE) ×2 IMPLANT
GOWN STRL REUS W/TWL 2XL LVL3 (GOWN DISPOSABLE) IMPLANT
GOWN STRL REUS W/TWL LRG LVL3 (GOWN DISPOSABLE) ×1
GOWN STRL REUS W/TWL XL LVL3 (GOWN DISPOSABLE) ×2
KIT BASIN OR (CUSTOM PROCEDURE TRAY) ×2 IMPLANT
KIT ROOM TURNOVER OR (KITS) ×2 IMPLANT
NEEDLE HYPO 22GX1.5 SAFETY (NEEDLE) ×2 IMPLANT
NEEDLE SPNL 22GX3.5 QUINCKE BK (NEEDLE) IMPLANT
NS IRRIG 1000ML POUR BTL (IV SOLUTION) ×2 IMPLANT
OIL CARTRIDGE MAESTRO DRILL (MISCELLANEOUS) ×2
PACK LAMINECTOMY NEURO (CUSTOM PROCEDURE TRAY) ×2 IMPLANT
PAD ARMBOARD 7.5X6 YLW CONV (MISCELLANEOUS) ×6 IMPLANT
RUBBERBAND STERILE (MISCELLANEOUS) ×4 IMPLANT
SPONGE SURGIFOAM ABS GEL SZ50 (HEMOSTASIS) ×2 IMPLANT
STRIP CLOSURE SKIN 1/2X4 (GAUZE/BANDAGES/DRESSINGS) ×2 IMPLANT
SUT VIC AB 2-0 CT1 18 (SUTURE) ×2 IMPLANT
SUT VIC AB 3-0 SH 8-18 (SUTURE) ×2 IMPLANT
TOWEL GREEN STERILE (TOWEL DISPOSABLE) ×2 IMPLANT
TOWEL GREEN STERILE FF (TOWEL DISPOSABLE) ×2 IMPLANT
WATER STERILE IRR 1000ML POUR (IV SOLUTION) ×2 IMPLANT

## 2016-12-30 NOTE — Discharge Instructions (Signed)

## 2016-12-30 NOTE — Op Note (Signed)
Date of procedure: 12/30/2016  Date of dictation: Same  Service: Neurosurgery  Preoperative diagnosis: Left L3-L4 herniated nucleus pulposis with radiculopathy  Postoperative diagnosis: Same  Procedure Name: Left L3-L4 laminotomy microdiscectomy.  Surgeon:Rawn Quiroa A.Dnya Hickle, M.D.  Asst. Surgeon: None  Anesthesia: General  Indication: 56 year old female with back and left lower extremity pain failing conservative management. Workup demonstrates evidence of a chronic-appearing inferior disc herniation at L3-4 on the left with compression the left L4 nerve root. Patient presents now for microdiscectomy.  Operative note: After induction anesthesia, patient position prone onto Wilson frame appropriate padded. Lumbar region prepped and draped sterilely. Incision made overlying L3-4. Dissection performed on the left. Retractor placed. X-ray taken. Level confirmed. Laminotomy performed using high-speed drill and Kerrison rongeurs. Ligament flavum elevated and resected. Underlying thecal sac and L4 nerve root identified. Microscope front field she might dissection the spinal canal. Epidural venous plexus was coagulated and cut. Thecal sac and L4 nerve gently mobilized. An inferor disc protrusion was encountered along the medial band border of the left L4 pedicle. This is dissected free and resected. The disc space itself was otherwise quite flat and not grossly violated. At this point a very thorough decompression been achieved. There is no his injury thecal sac and nerve roots. Wounds and irrigated out like solution. Gelfoam was placed topically for hemostasis. Wounds and close in layers of Vicryl sutures. Steri-Strips and sterile dressing applied. No apparent complications. Patient tolerated the procedure well and returns recovery room postop.

## 2016-12-30 NOTE — Anesthesia Procedure Notes (Signed)
Procedure Name: Intubation Date/Time: 12/30/2016 8:07 AM Performed by: Mariea Clonts Pre-anesthesia Checklist: Patient identified, Emergency Drugs available, Suction available and Patient being monitored Patient Re-evaluated:Patient Re-evaluated prior to inductionOxygen Delivery Method: Circle System Utilized Preoxygenation: Pre-oxygenation with 100% oxygen Intubation Type: IV induction Ventilation: Mask ventilation without difficulty Laryngoscope Size: Miller and 2 Grade View: Grade I Tube type: Oral Tube size: 7.0 mm Number of attempts: 1 Airway Equipment and Method: Stylet and Oral airway Placement Confirmation: ETT inserted through vocal cords under direct vision,  positive ETCO2 and breath sounds checked- equal and bilateral Tube secured with: Tape Dental Injury: Teeth and Oropharynx as per pre-operative assessment

## 2016-12-30 NOTE — Anesthesia Postprocedure Evaluation (Signed)
Anesthesia Post Note  Patient: Dana Goodwin  Procedure(s) Performed: Procedure(s) (LRB): Left Lumbar Three-Four Microdiscectomy (Left)  Patient location during evaluation: PACU Anesthesia Type: General Level of consciousness: sedated and awake Pain management: pain level controlled Vital Signs Assessment: post-procedure vital signs reviewed and stable Respiratory status: spontaneous breathing, nonlabored ventilation, respiratory function stable and patient connected to nasal cannula oxygen Cardiovascular status: blood pressure returned to baseline and stable Postop Assessment: no signs of nausea or vomiting Anesthetic complications: no       Last Vitals:  Vitals:   12/30/16 1054 12/30/16 1112  BP:  117/67  Pulse:  (!) 52  Resp:  18  Temp: 36.6 C 36.6 C    Last Pain:  Vitals:   12/30/16 1115  TempSrc:   PainSc: 3                  Damontay Alred,JAMES TERRILL

## 2016-12-30 NOTE — Brief Op Note (Signed)
12/30/2016  9:19 AM  PATIENT:  Dana Goodwin  56 y.o. female  PRE-OPERATIVE DIAGNOSIS:  HNP  POST-OPERATIVE DIAGNOSIS:  HNP  PROCEDURE:  Procedure(s): Left Lumbar Three-Four Microdiscectomy (Left)  SURGEON:  Surgeon(s) and Role:    * Earnie Larsson, MD - Primary  PHYSICIAN ASSISTANT:   ASSISTANTS:    ANESTHESIA:   general  EBL:  Total I/O In: 1000 [I.V.:1000] Out: -   BLOOD ADMINISTERED:none  DRAINS: none   LOCAL MEDICATIONS USED:  MARCAINE     SPECIMEN:  No Specimen  DISPOSITION OF SPECIMEN:  N/A  COUNTS:  YES  TOURNIQUET:  * No tourniquets in log *  DICTATION: .Dragon Dictation  PLAN OF CARE: Admit for overnight observation  PATIENT DISPOSITION:  PACU - hemodynamically stable.   Delay start of Pharmacological VTE agent (>24hrs) due to surgical blood loss or risk of bleeding: yes

## 2016-12-30 NOTE — H&P (Signed)
  Dana Goodwin is an 56 y.o. female.   Chief Complaint: Left leg pain HPI: 56 year old female with back and left lower extremity pain consistent with a left-sided L4 radiculopathy which is failed conservative management. Workup demonstrates evidence of an inferiorly migrated disc herniation at L3-4 on the left side with marked compression of her left L4 nerve root. Patient presents now for microdiscectomy in hopes of improving her symptoms.  Past Medical History:  Diagnosis Date  . History of colon polyps    benign  . Numbness    both leg   . Urinary frequency     Past Surgical History:  Procedure Laterality Date  . BACK SURGERY     x 2  . COLONOSCOPY N/A 06/19/2016   Procedure: COLONOSCOPY;  Surgeon: Danie Binder, MD;  [facility];  Service: Endoscopy;  Laterality: N/A;  9:15 Am  . FRACTURE SURGERY Left    left leg has rod and pins    Family History  Problem Relation Age of Onset  . Heart attack Mother   . Prostate cancer Father   . Heart attack Brother   . Diabetes Brother    Social History:  reports that she has been smoking.  She has a 18.50 pack-year smoking history. She has never used smokeless tobacco. She reports that she does not drink alcohol or use drugs.  Allergies:  Allergies  Allergen Reactions  . No Known Allergies     Medications Prior to Admission  Medication Sig Dispense Refill  . HYDROcodone-acetaminophen (NORCO/VICODIN) 5-325 MG tablet Take 1 tablet by mouth every 8 (eight) hours as needed for moderate pain. (Patient taking differently: Take 1 tablet by mouth 3 (three) times daily as needed (for pain.). ) 42 tablet 0  . cyclobenzaprine (FLEXERIL) 10 MG tablet TAKE 1 TABLET BY MOUTH TWICE DAILY AS NEEDED FOR MUSCLE SPASM (Patient not taking: Reported on 12/18/2016) 40 tablet 0  . gabapentin (NEURONTIN) 100 MG capsule Take 1 capsule (100 mg total) by mouth 3 (three) times daily. (Patient not taking: Reported on 12/18/2016) 90 capsule 2    No  results found for this or any previous visit (from the past 48 hour(s)). No results found.  Pertinent items noted in HPI and remainder of comprehensive ROS otherwise negative.  Blood pressure (!) 119/55, pulse (!) 56, temperature 97.9 F (36.6 C), temperature source Oral, resp. rate 18, SpO2 97 %.  Patient is awake and alert. She is oriented and appropriate. She is somewhat uncomfortable. Examination her head ears eyes nose and throat is unremarkable. Chest and abdomen are benign. Extremities are free from injury deformity. Examination neurologically finds her knee awake and alert. Oriented and appropriate. Cranial nerve function is intact. Motor examination extremities reveals mild weakness of her left quadriceps muscle group and left anterior tibialis otherwise motor strength intact. Sensory examination was decreased sensation to pinprick and light touch in her left L4 dermatome. Reflexes are normal active except her Achilles reflexes are absent bilaterally and her patellar reflex is diminished on the left. Assessment/Plan Left L3-4 herniated mucous pulposis with radiculopathy. Plan left L3-4 laminotomy and microdiscectomy. Risks and benefits of been explained. Patient wishes to proceed.  Merrianne Mccumbers A 12/30/2016, 7:42 AM

## 2016-12-30 NOTE — Anesthesia Preprocedure Evaluation (Signed)
Anesthesia Evaluation  Patient identified by MRN, date of birth, ID band Patient awake    Reviewed: Allergy & Precautions, NPO status , Patient's Chart, lab work & pertinent test results  Airway Mallampati: II  TM Distance: >3 FB Neck ROM: Full    Dental  (+) Partial Lower, Partial Upper   Pulmonary Current Smoker,    breath sounds clear to auscultation       Cardiovascular negative cardio ROS   Rhythm:Regular Rate:Normal     Neuro/Psych    GI/Hepatic negative GI ROS, Neg liver ROS,   Endo/Other  negative endocrine ROS  Renal/GU negative Renal ROS     Musculoskeletal negative musculoskeletal ROS (+)   Abdominal   Peds  Hematology negative hematology ROS (+)   Anesthesia Other Findings   Reproductive/Obstetrics                             Anesthesia Physical Anesthesia Plan  ASA: II  Anesthesia Plan: General   Post-op Pain Management:    Induction: Intravenous  Airway Management Planned: Oral ETT  Additional Equipment:   Intra-op Plan:   Post-operative Plan: Extubation in OR  Informed Consent: I have reviewed the patients History and Physical, chart, labs and discussed the procedure including the risks, benefits and alternatives for the proposed anesthesia with the patient or authorized representative who has indicated his/her understanding and acceptance.     Plan Discussed with: CRNA  Anesthesia Plan Comments:         Anesthesia Quick Evaluation

## 2016-12-30 NOTE — Progress Notes (Signed)
Pt doing well. Pt given D/C instructions with Rx's, verbal understanding was provided. Pt's incision is clean and dry with no sign of infection. Pt's IV was removed prior to D/C. Pt D/C'd home via wheelchair @ 1615 per MD order. Pt is stable @ D/C and has no other needs at this time. Holli Humbles, RN

## 2016-12-30 NOTE — Transfer of Care (Signed)
Immediate Anesthesia Transfer of Care Note  Patient: Dana Goodwin  Procedure(s) Performed: Procedure(s): Left Lumbar Three-Four Microdiscectomy (Left)  Patient Location: PACU  Anesthesia Type:General  Level of Consciousness: awake, alert  and oriented  Airway & Oxygen Therapy: Patient Spontanous Breathing  Post-op Assessment: Report given to RN, Post -op Vital signs reviewed and stable and Patient moving all extremities X 4  Post vital signs: Reviewed and stable  Last Vitals:  Vitals:   12/30/16 0632  BP: (!) 119/55  Pulse: (!) 56  Resp: 18  Temp: 36.6 C    Last Pain:  Vitals:   12/30/16 0640  TempSrc:   PainSc: 8          Complications: No apparent anesthesia complications

## 2016-12-31 ENCOUNTER — Encounter (HOSPITAL_COMMUNITY): Payer: Self-pay | Admitting: Neurosurgery

## 2016-12-31 NOTE — Discharge Summary (Signed)
Physician Discharge Summary  Patient ID: Dana Goodwin MRN: [id number] DOB/AGE: [birth year] 56 y.o.  Admit date: 12/30/2016 Discharge date: 12/31/2016  Admission Diagnoses:  Discharge Diagnoses:  Active Problems:   Lumbar disc herniation   Discharged Condition: good  Hospital Course: Patient admitted to the hospital for treatment of a sympathetic lumbar disc herniation. She underwent an, native surgery and left later that day.  Consults:   Significant Diagnostic Studies:   Treatments:   Discharge Exam: Blood pressure 100/62, pulse (!) 50, temperature 97.8 F (36.6 C), resp. rate 18, SpO2 100 %. Awake and alert. Oriented and appropriate. Cranial nerve function intact. Motor and sensory function extremities intact. Wound clean and dry. Chest abdomen benign.  Disposition: 01-Home or Self Care   Allergies as of 12/30/2016      Reactions   No Known Allergies       Medication List    TAKE these medications   cyclobenzaprine 10 MG tablet Commonly known as:  FLEXERIL Take 1 tablet (10 mg total) by mouth 3 (three) times daily as needed for muscle spasms. What changed:  See the new instructions.   gabapentin 100 MG capsule Commonly known as:  NEURONTIN Take 1 capsule (100 mg total) by mouth 3 (three) times daily.   HYDROcodone-acetaminophen 5-325 MG tablet Commonly known as:  NORCO/VICODIN Take 1-2 tablets by mouth every 8 (eight) hours as needed for moderate pain. What changed:  how much to take      Follow-up Information    Brandn Mcgath A, MD In 2 weeks.   Specialty:  Neurosurgery Contact information: [address] [place] [address] [phone]           Signed: Charlie Pitter 12/31/2016, 7:41 AM

## 2017-03-05 NOTE — Anesthesia Postprocedure Evaluation (Signed)
Anesthesia Post Note  Patient: Dana Goodwin  Procedure(s) Performed: Procedure(s) (LRB): Left Lumbar Three-Four Microdiscectomy (Left)     Anesthesia Post Evaluation  Last Vitals:  Vitals:   12/30/16 1112 12/30/16 1246  BP: 117/67 100/62  Pulse: (!) 52 (!) 50  Resp: 18 18  Temp: 36.6 C 36.6 C    Last Pain:  Vitals:   12/30/16 1400  TempSrc:   PainSc: 3                  Jamielyn Petrucci,JAMES TERRILL

## 2017-03-05 NOTE — Addendum Note (Signed)
Addendum  created 03/05/17 1302 by Rica Koyanagi, MD   Sign clinical note

## 2017-08-16 ENCOUNTER — Other Ambulatory Visit (HOSPITAL_COMMUNITY): Payer: Self-pay | Admitting: Internal Medicine

## 2017-08-16 DIAGNOSIS — Z1231 Encounter for screening mammogram for malignant neoplasm of breast: Secondary | ICD-10-CM

## 2017-08-18 ENCOUNTER — Ambulatory Visit (HOSPITAL_COMMUNITY)
Admission: RE | Admit: 2017-08-18 | Discharge: 2017-08-18 | Disposition: A | Discharge status: Home / Self Care | Payer: Medicaid Other | Source: Ambulatory Visit | Attending: Internal Medicine | Admitting: Internal Medicine

## 2017-08-18 DIAGNOSIS — Z1231 Encounter for screening mammogram for malignant neoplasm of breast: Secondary | ICD-10-CM | POA: Diagnosis not present

## 2017-10-20 ENCOUNTER — Emergency Department (HOSPITAL_COMMUNITY): Payer: Medicaid Other

## 2017-10-20 ENCOUNTER — Other Ambulatory Visit: Payer: Self-pay

## 2017-10-20 ENCOUNTER — Emergency Department (HOSPITAL_COMMUNITY)
Admission: EM | Admit: 2017-10-20 | Discharge: 2017-10-20 | Disposition: A | Discharge status: Home / Self Care | Payer: Medicaid Other | Attending: Emergency Medicine | Admitting: Emergency Medicine

## 2017-10-20 ENCOUNTER — Encounter (HOSPITAL_COMMUNITY): Payer: Self-pay | Admitting: Emergency Medicine

## 2017-10-20 DIAGNOSIS — Y929 Unspecified place or not applicable: Secondary | ICD-10-CM | POA: Diagnosis not present

## 2017-10-20 DIAGNOSIS — Y999 Unspecified external cause status: Secondary | ICD-10-CM | POA: Insufficient documentation

## 2017-10-20 DIAGNOSIS — X509XXA Other and unspecified overexertion or strenuous movements or postures, initial encounter: Secondary | ICD-10-CM | POA: Diagnosis not present

## 2017-10-20 DIAGNOSIS — M25562 Pain in left knee: Secondary | ICD-10-CM | POA: Insufficient documentation

## 2017-10-20 DIAGNOSIS — Y939 Activity, unspecified: Secondary | ICD-10-CM | POA: Insufficient documentation

## 2017-10-20 DIAGNOSIS — M4846XA Fatigue fracture of vertebra, lumbar region, initial encounter for fracture: Secondary | ICD-10-CM | POA: Insufficient documentation

## 2017-10-20 DIAGNOSIS — F17200 Nicotine dependence, unspecified, uncomplicated: Secondary | ICD-10-CM | POA: Insufficient documentation

## 2017-10-20 DIAGNOSIS — S3992XA Unspecified injury of lower back, initial encounter: Secondary | ICD-10-CM | POA: Diagnosis present

## 2017-10-20 LAB — CBC WITH DIFFERENTIAL/PLATELET
Basophils Absolute: 0 10*3/uL (ref 0.0–0.1)
Basophils Relative: 1 %
Eosinophils Absolute: 0.1 10*3/uL (ref 0.0–0.7)
Eosinophils Relative: 2 %
HCT: 42.6 % (ref 36.0–46.0)
Hemoglobin: 14.1 g/dL (ref 12.0–15.0)
Lymphocytes Relative: 22 %
Lymphs Abs: 1.3 10*3/uL (ref 0.7–4.0)
MCH: 30.2 pg (ref 26.0–34.0)
MCHC: 33.1 g/dL (ref 30.0–36.0)
MCV: 91.2 fL (ref 78.0–100.0)
Monocytes Absolute: 0.6 10*3/uL (ref 0.1–1.0)
Monocytes Relative: 10 %
Neutro Abs: 3.9 10*3/uL (ref 1.7–7.7)
Neutrophils Relative %: 65 %
Platelets: 239 10*3/uL (ref 150–400)
RBC: 4.67 MIL/uL (ref 3.87–5.11)
RDW: 13.4 % (ref 11.5–15.5)
WBC: 5.9 10*3/uL (ref 4.0–10.5)

## 2017-10-20 LAB — BASIC METABOLIC PANEL
Anion gap: 9 (ref 5–15)
BUN: 13 mg/dL (ref 6–20)
CO2: 27 mmol/L (ref 22–32)
Calcium: 9.7 mg/dL (ref 8.9–10.3)
Chloride: 103 mmol/L (ref 101–111)
Creatinine, Ser: 0.67 mg/dL (ref 0.44–1.00)
GFR calc Af Amer: >60 mL/min (ref >60)
GFR calc non Af Amer: >60 mL/min (ref >60)
Glucose, Bld: 89 mg/dL (ref 65–99)
Potassium: 4.1 mmol/L (ref 3.5–5.1)
Sodium: 139 mmol/L (ref 135–145)

## 2017-10-20 MED ORDER — OXYCODONE-ACETAMINOPHEN 5-325 MG PO TABS
1.0000 | ORAL_TABLET | Freq: Once | ORAL | Status: AC
  Administered 2017-10-20: 1 via ORAL
  Filled 2017-10-20: qty 1

## 2017-10-20 MED ORDER — IBUPROFEN 600 MG PO TABS: 600.0000 mg | ORAL_TABLET | Freq: Four times a day (QID) | ORAL | 0 refills | Status: DC | PRN

## 2017-10-20 MED ORDER — HYDROCODONE-ACETAMINOPHEN 5-325 MG PO TABS: 1.0000 | ORAL_TABLET | ORAL | 0 refills | Status: DC | PRN

## 2017-10-20 NOTE — ED Provider Notes (Signed)
[facility] EMERGENCY DEPARTMENT Provider Note   CSN: [id number] Arrival date & time: 10/20/17  1275     History   Chief Complaint Chief Complaint  Patient presents with  . Back Pain    HPI Dana Goodwin is a 57 y.o. female past medical history of lumbar laminectomy in 2018 and left knee surgery secondary to trauma approximately 5 years ago presenting with pain in her left knee and her lower back for the past 4 days after attempting to help roll a generator onto the back of a pickup truck.  She describes sudden onset of pain in her lower, she went inside to sit down and when she stood up she passed out hitting her left knee when she fell.  She also sustained an abrasion to her right cheek, but denies pain at the site.  She has been able to weight-bear but has had persistent pain in the knee.  She denies numbness or weakness in her lower extremities and has had no urinary or fecal incontinence or retention.  She denies chest pain, shortness of breath, palpitations or any persistent dizziness.  She has taken hydrocodone which she had left over from her back surgery, it did not relieve her symptoms, but she states the medication was old.        The history is provided by the patient.    Past Medical History:  Diagnosis Date  . History of colon polyps    benign  . Numbness    both leg   . Urinary frequency     Patient Active Problem List   Diagnosis Date Noted  . 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 Special screening for malignant neoplasms, colon     Past Surgical History:  Procedure Laterality Date  . BACK SURGERY     x 2  . COLONOSCOPY N/A 06/19/2016   Procedure: COLONOSCOPY;  Surgeon: Danie Binder, MD;  [facility];  Service: Endoscopy;  Laterality: N/A;  9:15 Am  . FRACTURE SURGERY Left    left leg has rod and pins  . LUMBAR LAMINECTOMY/DECOMPRESSION MICRODISCECTOMY Left 12/30/2016   Procedure: Left Lumbar Three-Four Microdiscectomy;  Surgeon: Earnie Larsson, MD;  Location: [place];  Service: Neurosurgery;  Laterality: Left;    OB History    No data available       Home Medications    Prior to Admission medications   Medication Sig Start Date End Date Taking? Authorizing Provider  HYDROcodone-acetaminophen (NORCO/VICODIN) 5-325 MG tablet Take 1 tablet by mouth every 4 (four) hours as needed. 10/20/17   Evalee Jefferson, PA-C  ibuprofen (ADVIL,MOTRIN) 600 MG tablet Take 1 tablet (600 mg total) by mouth every 6 (six) hours as needed. 10/20/17   Evalee Jefferson, PA-C    Family History Family History  Problem Relation Age of Onset  . Heart attack Mother   . Prostate cancer Father   . Heart attack Brother   . Diabetes Brother     Social History Social History   Tobacco Use  . Smoking status: Current Every Day Smoker    Packs/day: 0.50    Years: 37.00    Pack years: 18.50  . Smokeless tobacco: Never Used  Substance Use Topics  . Alcohol use: No  . Drug use: No     Allergies   No known allergies   Review of Systems Review of Systems  Constitutional: Negative for fever.  HENT: Negative for congestion and sore throat.   Eyes: Negative.  Respiratory: Negative for chest tightness and shortness of breath.   Cardiovascular: Negative for chest pain and palpitations.  Gastrointestinal: Negative for abdominal pain and nausea.  Genitourinary: Negative.   Musculoskeletal: Positive for arthralgias and back pain. Negative for joint swelling and neck pain.  Skin: Negative.  Negative for rash and wound.  Neurological: Positive for syncope. Negative for dizziness, weakness, light-headedness, numbness and headaches.  Psychiatric/Behavioral: Negative.      Physical Exam Updated Vital Signs BP (!) 132/92 (BP Location: Right Arm)   Pulse 67   Temp 98.7 F (37.1 C) (Oral)   Resp 18   Ht 5\' 4"  (1.626 m)   Wt 54.4 kg (120 lb)   SpO2 99%   BMI 20.60 kg/m   Physical Exam  Constitutional: She appears well-developed and well-nourished.    HENT:  Head: Atraumatic.  Neck: Normal range of motion.  Cardiovascular:  Pulses equal bilaterally  Musculoskeletal: She exhibits tenderness.       Left knee: She exhibits no ecchymosis, no erythema, no LCL laxity and no MCL laxity. Tenderness found. Patellar tendon tenderness noted.       Lumbar back: She exhibits bony tenderness. She exhibits no deformity and no spasm.  There is a soft swelling over the left tibial tuberosity which patient states is chronic since her surgery.  It is nontender.  Neurological: She is alert. She has normal strength. She displays normal reflexes. No sensory deficit.  Skin: Skin is warm and dry.  Small abrasion right maxilla. No bony tenderness or deformity.  Psychiatric: She has a normal mood and affect.     ED Treatments / Results  Labs (all labs ordered are listed, but only abnormal results are displayed) Labs Reviewed  BASIC METABOLIC PANEL  CBC WITH DIFFERENTIAL/PLATELET    EKG  EKG Interpretation None       Radiology Dg Lumbar Spine Complete  Result Date: 10/20/2017 CLINICAL DATA:  Fall EXAM: LUMBAR SPINE - COMPLETE 4+ VIEW COMPARISON:  Radiographs 07/21/2016, MRI 07/31/2016 FINDINGS: Transitional lumbosacral anatomy. L5 is partially incorporated into the sacrum, numbering consistent with prior MRI. Small ribs at T12. Mild superior endplate fracture of L2 was not present on prior studies and may be acute. No other fracture. Normal alignment. Moderate disc degeneration and spurring L3-4, L4-5 IMPRESSION: Mild superior endplate fracture of L2 may be acute and is a new finding. Electronically Signed   By: Franchot Gallo M.D.   On: 10/20/2017 10:46   Dg Knee Complete 4 Views Left  Result Date: 10/20/2017 CLINICAL DATA:  Fall EXAM: LEFT KNEE - COMPLETE 4+ VIEW COMPARISON:  None. FINDINGS: Normal alignment no acute fracture. Joint spaces intact without effusion. Intramedullary rod in the tibia from prior fracture fixation. 2 locking pins extend  beyond the cortex of the tibia medially and laterally. No evidence of hardware loosening. Chronic healed fracture proximal fibula. IMPRESSION: No acute abnormality. Electronically Signed   By: Franchot Gallo M.D.   On: 10/20/2017 10:40    Procedures Procedures (including critical care time)  Medications Ordered in ED Medications  oxyCODONE-acetaminophen (PERCOCET/ROXICET) 5-325 MG per tablet 1 tablet (1 tablet Oral Given 10/20/17 0958)     Initial Impression / Assessment and Plan / ED Course  I have reviewed the triage vital signs and the nursing notes.  Pertinent labs & imaging results that were available during my care of the patient were reviewed by me and considered in my medical decision making (see chart for details).     Discussed results of  imaging with pt. Referrals given for f/u care. Heat tx, activity as tolerated, hydrocodone, ibuprofen.   Final Clinical Impressions(s) / ED Diagnoses   Final diagnoses:  Stress fracture of lumbar vertebra, initial encounter  Acute pain of left knee    ED Discharge Orders        Ordered    HYDROcodone-acetaminophen (NORCO/VICODIN) 5-325 MG tablet  Every 4 hours PRN     10/20/17 1126    ibuprofen (ADVIL,MOTRIN) 600 MG tablet  Every 6 hours PRN     10/20/17 1131       Evalee Jefferson, PA-C 10/20/17 1132    Daleen Bo, MD 10/20/17 1750

## 2017-10-20 NOTE — Discharge Instructions (Signed)
Rest and use a heating pad applied your back several times daily for 20 minutes, activity as tolerated as discussed.  You may take the hydrocodone prescribed for pain relief.  This will make you drowsy - do not drive within 4 hours of taking this medication. You will also benefit from an anti inflammatory which has been prescribed.

## 2017-10-20 NOTE — ED Triage Notes (Signed)
Pt reports was helping load a generator onto a truck on Sunday. Pt reports back pain ever since.pt reports after loading truck went back in the house to sit down. Pt reports got up from chair and passed out. Pt reports left knee pain and right cheek pain ever since. Pt able to bear weight on LLE. NAD noted.

## 2017-10-26 ENCOUNTER — Telehealth: Payer: Self-pay | Admitting: Orthopedic Surgery

## 2017-10-26 NOTE — Telephone Encounter (Signed)
Patient called and stated that she was seen in the ED on 10-20-17 for her knee and her back.  She stated to me that she had back surgery on 12-30-16 done by Dr. Trenton Gammon.  After speaking her with for a few minutes, she complained not of knee pain anymore but of pain in her leg that goes from one side of her leg and then the other side mainly near her ankle.  I told her that she needs to go to her PCP to be evaluated for this problem.  I told her that because of her insurance, she would need to get a referral for this office from them if need be  She understood.  She states she will call her PCP at [facility]

## 2018-02-21 IMAGING — CR DG LUMBAR SPINE 1V
1 series · 1 of 1 positions shown · non-contrast
Comparison: Lumbar radiographs July 21, 2016 and lumbar MRI
July 31, 2016

CLINICAL DATA: L3-4 micro diskectomy

EXAM:
LUMBAR SPINE - 1 VIEW

[lateral]
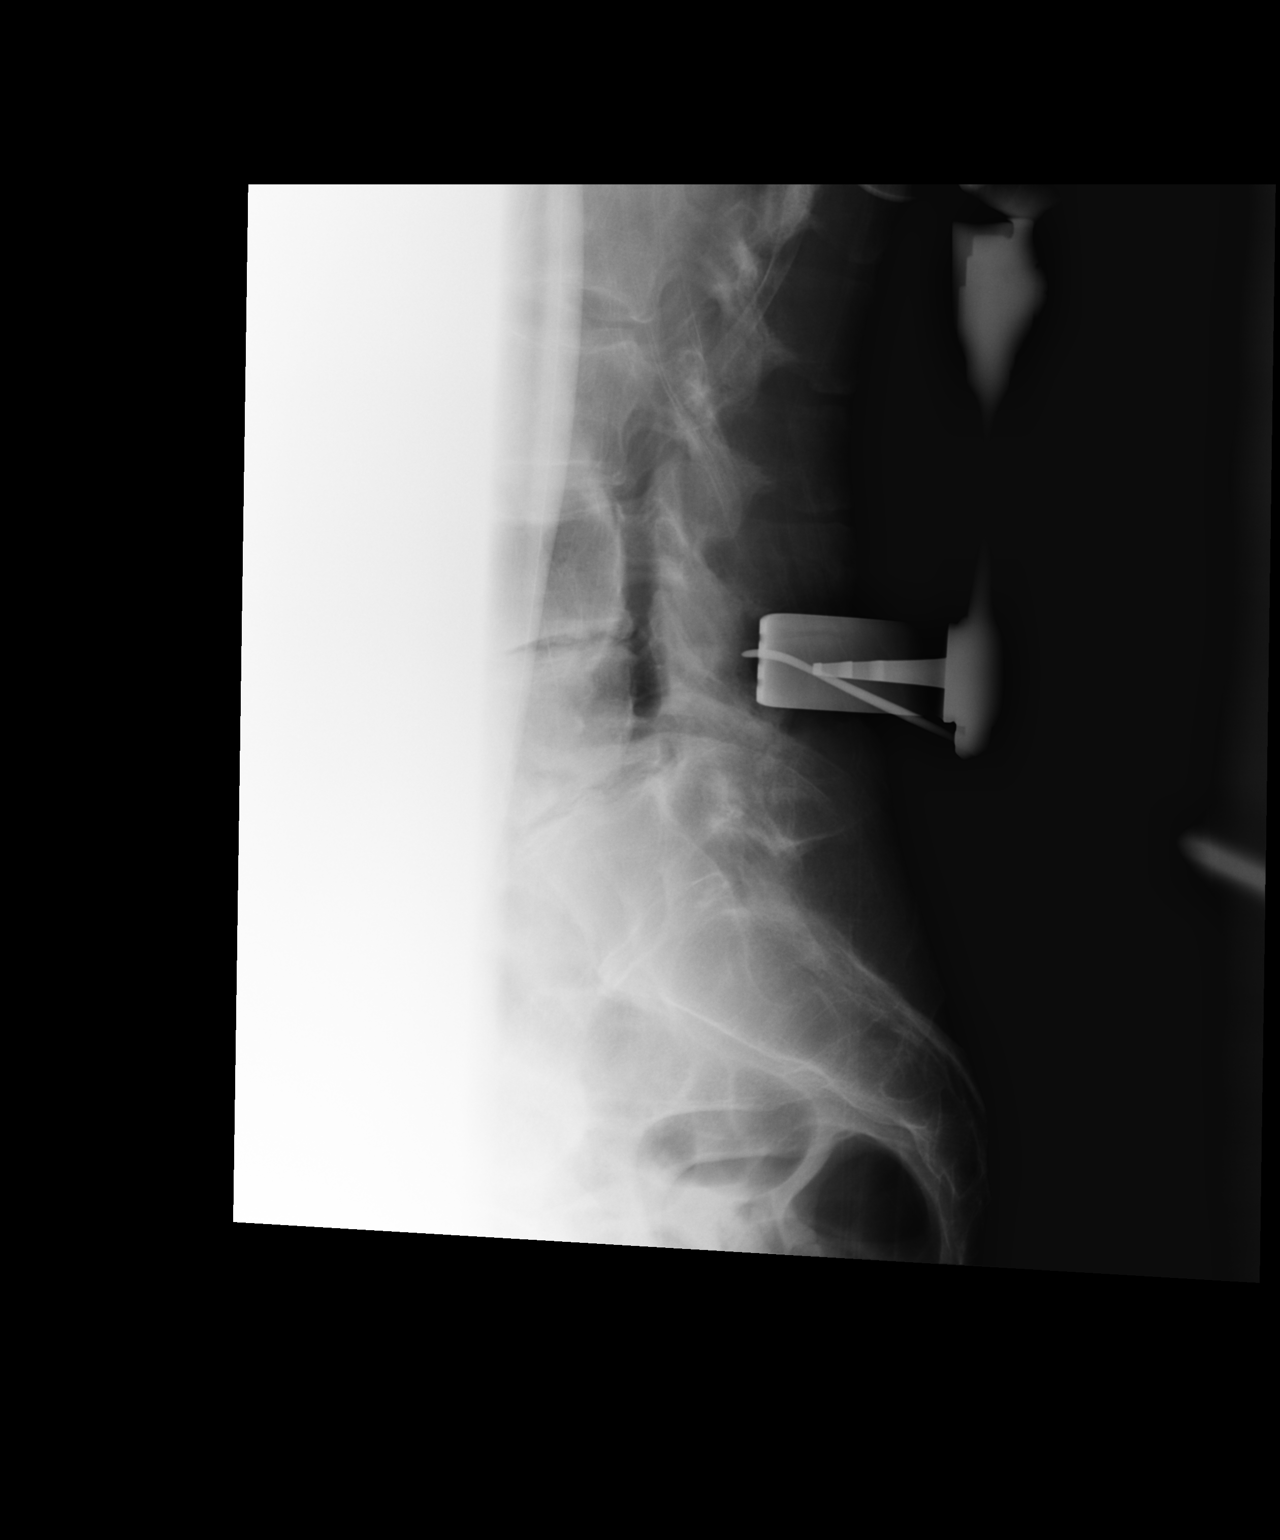

[1 of 1 positions shown; findings below may reference images not displayed]

FINDINGS: Cross-table lateral lumbar image labeled #1 submitted. Metallic
probe tip is posterior to the L4-5 interspace. There is moderately
severe disc space narrowing at L3-4, L4-5, and L5-S1. No fracture or
spondylolisthesis.
IMPRESSION: Metallic probe tip is posterior to the L4-5 interspace level.
Cutting tool overlies the L4 spinous process. Osteoarthritic change
at several levels.

## 2018-07-07 ENCOUNTER — Other Ambulatory Visit: Payer: Self-pay

## 2018-07-07 ENCOUNTER — Encounter (HOSPITAL_COMMUNITY): Payer: Self-pay | Admitting: Emergency Medicine

## 2018-07-07 ENCOUNTER — Emergency Department (HOSPITAL_COMMUNITY)
Admission: EM | Admit: 2018-07-07 | Discharge: 2018-07-07 | Disposition: A | Discharge status: Home / Self Care | Payer: Medicaid Other | Attending: Emergency Medicine | Admitting: Emergency Medicine

## 2018-07-07 DIAGNOSIS — B028 Zoster with other complications: Secondary | ICD-10-CM | POA: Diagnosis not present

## 2018-07-07 DIAGNOSIS — F17200 Nicotine dependence, unspecified, uncomplicated: Secondary | ICD-10-CM | POA: Insufficient documentation

## 2018-07-07 DIAGNOSIS — Z79899 Other long term (current) drug therapy: Secondary | ICD-10-CM | POA: Insufficient documentation

## 2018-07-07 DIAGNOSIS — M79662 Pain in left lower leg: Secondary | ICD-10-CM | POA: Diagnosis present

## 2018-07-07 HISTORY — DX: Sciatica, unspecified side: M54.30

## 2018-07-07 HISTORY — DX: Other chronic pain: G89.29

## 2018-07-07 HISTORY — DX: Dorsalgia, unspecified: M54.9

## 2018-07-07 MED ORDER — ACYCLOVIR 800 MG PO TABS: ORAL_TABLET | ORAL | 0 refills | Status: DC

## 2018-07-07 MED ORDER — OXYCODONE-ACETAMINOPHEN 5-325 MG PO TABS: 1.0000 | ORAL_TABLET | ORAL | 0 refills | Status: DC | PRN

## 2018-07-07 NOTE — ED Notes (Signed)
Blister type rash noted to left foot area

## 2018-07-07 NOTE — Discharge Instructions (Addendum)
See your Physician for recheck next week  °

## 2018-07-07 NOTE — ED Provider Notes (Signed)
[facility] EMERGENCY DEPARTMENT Provider Note   CSN: [id number] Arrival date & time: 07/07/18  1731     History   Chief Complaint Chief Complaint  Patient presents with  . Leg Pain    HPI Dana Goodwin is a 57 y.o. female.  The history is provided by the patient. No language interpreter was used.  Leg Pain   This is a new problem. The current episode started more than 1 week ago. The problem occurs constantly. The pain is present in the left lower leg, left foot and back. The quality of the pain is described as aching. The pain is severe. Associated symptoms include numbness. She has tried nothing for the symptoms. The treatment provided no relief. There has been no history of extremity trauma.  Pt complains of pain in the left sciatic area. Pt reports she has a rash on her foot and the back of her leg.  Pt reports pain goes down her leg.   Past Medical History:  Diagnosis Date  . Chronic back pain   . History of colon polyps    benign  . Numbness    both leg   . Sciatica   . Urinary frequency     Patient Active Problem List   Diagnosis Date Noted  . 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 Special screening for malignant neoplasms, colon     Past Surgical History:  Procedure Laterality Date  . BACK SURGERY     x 2  . COLONOSCOPY N/A 06/19/2016   Procedure: COLONOSCOPY;  Surgeon: Danie Binder, MD;  [facility];  Service: Endoscopy;  Laterality: N/A;  9:15 Am  . FRACTURE SURGERY Left    left leg has rod and pins  . LUMBAR LAMINECTOMY/DECOMPRESSION MICRODISCECTOMY Left 12/30/2016   Procedure: Left Lumbar Three-Four Microdiscectomy;  Surgeon: Earnie Larsson, MD;  Location: [place];  Service: Neurosurgery;  Laterality: Left;     OB History   None      Home Medications    Prior to Admission medications   Medication Sig Start Date End Date Taking? Authorizing Provider  acyclovir (ZOVIRAX) 800 MG tablet One po qid 07/07/18   Fransico Meadow, PA-C    HYDROcodone-acetaminophen (NORCO/VICODIN) 5-325 MG tablet Take 1 tablet by mouth every 4 (four) hours as needed. 10/20/17   Evalee Jefferson, PA-C  ibuprofen (ADVIL,MOTRIN) 600 MG tablet Take 1 tablet (600 mg total) by mouth every 6 (six) hours as needed. 10/20/17   Evalee Jefferson, PA-C  oxyCODONE-acetaminophen (PERCOCET/ROXICET) 5-325 MG tablet Take 1 tablet by mouth every 4 (four) hours as needed for severe pain. 07/07/18   Fransico Meadow, PA-C    Family History Family History  Problem Relation Age of Onset  . Heart attack Mother   . Prostate cancer Father   . Heart attack Brother   . Diabetes Brother     Social History Social History   Tobacco Use  . Smoking status: Current Every Day Smoker    Packs/day: 0.50    Years: 37.00    Pack years: 18.50  . Smokeless tobacco: Never Used  Substance Use Topics  . Alcohol use: No  . Drug use: No     Allergies   No known allergies   Review of Systems Review of Systems  Skin: Positive for rash.  Neurological: Positive for numbness.  All other systems reviewed and are negative.    Physical Exam Updated Vital Signs BP 125/72 (BP Location: Right Arm)   Pulse  80   Temp 98.4 F (36.9 C) (Oral)   Resp 18   Ht 5\' 4"  (1.626 m)   Wt 53.5 kg   SpO2 97%   BMI 20.25 kg/m   Physical Exam  Constitutional: She appears well-developed and well-nourished.  HENT:  Head: Normocephalic.  Eyes: Pupils are equal, round, and reactive to light.  Cardiovascular: Normal rate.  Pulmonary/Chest: Effort normal.  Neurological: She is alert.  Skin: Rash noted.  Raised pustules left outer foot, rash back of left upper leg.   Psychiatric: She has a normal mood and affect.  Nursing note and vitals reviewed.    ED Treatments / Results  Labs (all labs ordered are listed, but only abnormal results are displayed) Labs Reviewed - No data to display  EKG None  Radiology No results found.  Procedures Procedures (including critical care  time)  Medications Ordered in ED Medications - No data to display   Initial Impression / Assessment and Plan / ED Course  I have reviewed the triage vital signs and the nursing notes.  Pertinent labs & imaging results that were available during my care of the patient were reviewed by me and considered in my medical decision making (see chart for details).     MDM   Pt counseled on shingles.   Pt given rx for acyclovir and percocet.   Pt advised to see her MD for recheck next week.   Final Clinical Impressions(s) / ED Diagnoses   Final diagnoses:  Herpes zoster with other complication    ED Discharge Orders         Ordered    acyclovir (ZOVIRAX) 800 MG tablet     07/07/18 1750    oxyCODONE-acetaminophen (PERCOCET/ROXICET) 5-325 MG tablet  Every 4 hours PRN     07/07/18 1750        An After Visit Summary was printed and given to the patient.    Sidney Ace 07/07/18 1942    Davonna Belling, MD 07/07/18 2337

## 2018-07-07 NOTE — ED Triage Notes (Signed)
Pt c/o LT leg pain that radiates from buttocks to foot. Hx of sciatica.

## 2018-08-16 ENCOUNTER — Emergency Department (HOSPITAL_COMMUNITY)
Admission: EM | Admit: 2018-08-16 | Discharge: 2018-08-16 | Disposition: A | Discharge status: Home / Self Care | Payer: Medicaid Other | Attending: Emergency Medicine | Admitting: Emergency Medicine

## 2018-08-16 ENCOUNTER — Encounter (HOSPITAL_COMMUNITY): Payer: Self-pay | Admitting: *Deleted

## 2018-08-16 ENCOUNTER — Other Ambulatory Visit: Payer: Self-pay

## 2018-08-16 DIAGNOSIS — M5430 Sciatica, unspecified side: Secondary | ICD-10-CM | POA: Diagnosis not present

## 2018-08-16 DIAGNOSIS — Z79899 Other long term (current) drug therapy: Secondary | ICD-10-CM | POA: Diagnosis not present

## 2018-08-16 DIAGNOSIS — B0223 Postherpetic polyneuropathy: Secondary | ICD-10-CM | POA: Insufficient documentation

## 2018-08-16 DIAGNOSIS — B0229 Other postherpetic nervous system involvement: Secondary | ICD-10-CM

## 2018-08-16 DIAGNOSIS — M549 Dorsalgia, unspecified: Secondary | ICD-10-CM | POA: Diagnosis present

## 2018-08-16 DIAGNOSIS — F17200 Nicotine dependence, unspecified, uncomplicated: Secondary | ICD-10-CM | POA: Insufficient documentation

## 2018-08-16 MED ORDER — GABAPENTIN 100 MG PO CAPS: 100.0000 mg | ORAL_CAPSULE | Freq: Every day | ORAL | 0 refills | Status: DC

## 2018-08-16 MED ORDER — PREDNISONE 20 MG PO TABS: 40.0000 mg | ORAL_TABLET | Freq: Every day | ORAL | 0 refills | Status: DC

## 2018-08-16 NOTE — Discharge Instructions (Addendum)
Follow-up with the pain clinic as planned.

## 2018-08-16 NOTE — ED Notes (Signed)
Patient given discharge instruction, verbalized understand. Patient ambulatory out of the department.  

## 2018-08-16 NOTE — ED Triage Notes (Signed)
Pt c/o lower back pain with pain radiating down left leg; pt is still recovering from shingles and still has blisters on her left foot

## 2018-08-16 NOTE — ED Provider Notes (Signed)
[facility] EMERGENCY DEPARTMENT Provider Note   CSN: [id number] Arrival date & time: 08/16/18  1737     History   Chief Complaint Chief Complaint  Patient presents with  . Back Pain    HPI Dana Goodwin is a 57 y.o. female.  HPI Patient presents with back pain.  Going down left leg.  Has chronic left lower back pain that does usually go down the leg but is been worse recently.  States her primary care doctor is going to get into a pain clinic.  However also has had recent shingles on her left foot.Seen about 6 weeks ago for the shingles.  Now it is scabbed but states the pain is still severe on the left foot and works its way up the back of the leg.  States she did have some lesions on the back of her calf.  No chest pain or trouble breathing.  No trauma.  Has previously been on Neurontin but states it made her sleepy.  She was taking 100 mg 3 times a day.  Has seen Dr. Trenton Gammon previously for her back. Past Medical History:  Diagnosis Date  . Chronic back pain   . History of colon polyps    benign  . Numbness    both leg   . Sciatica   . Urinary frequency     Patient Active Problem List   Diagnosis Date Noted  . 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 Special screening for malignant neoplasms, colon     Past Surgical History:  Procedure Laterality Date  . BACK SURGERY     x 2  . COLONOSCOPY N/A 06/19/2016   Procedure: COLONOSCOPY;  Surgeon: Danie Binder, MD;  [facility];  Service: Endoscopy;  Laterality: N/A;  9:15 Am  . FRACTURE SURGERY Left    left leg has rod and pins  . LUMBAR LAMINECTOMY/DECOMPRESSION MICRODISCECTOMY Left 12/30/2016   Procedure: Left Lumbar Three-Four Microdiscectomy;  Surgeon: Earnie Larsson, MD;  Location: [place];  Service: Neurosurgery;  Laterality: Left;     OB History   None      Home Medications    Prior to Admission medications   Medication Sig Start Date End Date Taking? Authorizing Provider  acyclovir (ZOVIRAX) 800 MG  tablet One po qid 07/07/18   Caryl Ada K, PA-C  gabapentin (NEURONTIN) 100 MG capsule Take 1 capsule (100 mg total) by mouth at bedtime. 08/16/18   Davonna Belling, MD  HYDROcodone-acetaminophen (NORCO/VICODIN) 5-325 MG tablet Take 1 tablet by mouth every 4 (four) hours as needed. 10/20/17   Evalee Jefferson, PA-C  ibuprofen (ADVIL,MOTRIN) 600 MG tablet Take 1 tablet (600 mg total) by mouth every 6 (six) hours as needed. 10/20/17   Evalee Jefferson, PA-C  oxyCODONE-acetaminophen (PERCOCET/ROXICET) 5-325 MG tablet Take 1 tablet by mouth every 4 (four) hours as needed for severe pain. 07/07/18   Fransico Meadow, PA-C  predniSONE (DELTASONE) 20 MG tablet Take 2 tablets (40 mg total) by mouth daily. 08/16/18   Davonna Belling, MD    Family History Family History  Problem Relation Age of Onset  . Heart attack Mother   . Prostate cancer Father   . Heart attack Brother   . Diabetes Brother     Social History Social History   Tobacco Use  . Smoking status: Current Every Day Smoker    Packs/day: 0.50    Years: 37.00    Pack years: 18.50  . Smokeless tobacco: Never Used  Substance Use  Topics  . Alcohol use: No  . Drug use: No     Allergies   No known allergies   Review of Systems Review of Systems  Constitutional: Negative for appetite change and fever.  Respiratory: Negative for shortness of breath.   Cardiovascular: Negative for chest pain.  Gastrointestinal: Negative for abdominal pain.  Genitourinary: Negative for flank pain.  Musculoskeletal: Positive for back pain. Negative for neck pain.  Skin: Positive for rash.  Neurological: Positive for numbness. Negative for weakness.  Psychiatric/Behavioral: Negative for confusion.     Physical Exam Updated Vital Signs BP 133/64 (BP Location: Right Arm)   Pulse 86   Temp 97.9 F (36.6 C) (Oral)   Ht 5\' 4"  (1.626 m)   Wt 52.2 kg   SpO2 96%   BMI 19.74 kg/m   Physical Exam  Constitutional: She appears well-developed.  HENT:    Head: Normocephalic.  Cardiovascular: Normal rate.  Abdominal: There is no tenderness.  Musculoskeletal: She exhibits tenderness.  Tenderness with some blisters of her left foot.  No erythema.  Tenderness also of the posterior calf.  Only tender on the skin without deep tenderness.  Good flexion and extension at the ankle.  Some lumbar tenderness also.  Good straight leg raise on left.  Skin: Skin is warm. Capillary refill takes less than 2 seconds.  Psychiatric: She has a normal mood and affect.     ED Treatments / Results  Labs (all labs ordered are listed, but only abnormal results are displayed) Labs Reviewed - No data to display  EKG None  Radiology No results found.  Procedures Procedures (including critical care time)  Medications Ordered in ED Medications - No data to display   Initial Impression / Assessment and Plan / ED Course  I have reviewed the triage vital signs and the nursing notes.  Pertinent labs & imaging results that were available during my care of the patient were reviewed by me and considered in my medical decision making (see chart for details).     Patient with foot back pain and posterior leg pain.  I think this is likely either postherpetic neuralgia or radiculopathy coming from her back.  Has been on opiates previously.  Will give low-dose Neurontin since she has made her sleepy in the past and will give short course of steroids.  Has follow-up with pain management.  Will discharge.  Doubt DVT or infection.  Final Clinical Impressions(s) / ED Diagnoses   Final diagnoses:  Post herpetic neuralgia  Sciatica, unspecified laterality    ED Discharge Orders         Ordered    gabapentin (NEURONTIN) 100 MG capsule  Daily at bedtime,   Status:  Discontinued     08/16/18 1840    predniSONE (DELTASONE) 20 MG tablet  Daily     08/16/18 1840    gabapentin (NEURONTIN) 100 MG capsule  Daily at bedtime     08/16/18 1842           Davonna Belling, MD 08/16/18 1853

## 2018-08-18 ENCOUNTER — Encounter: Payer: Self-pay | Admitting: Student in an Organized Health Care Education/Training Program

## 2018-08-18 ENCOUNTER — Other Ambulatory Visit: Payer: Self-pay

## 2018-08-18 ENCOUNTER — Ambulatory Visit
Payer: Medicaid Other | Attending: Student in an Organized Health Care Education/Training Program | Admitting: Student in an Organized Health Care Education/Training Program

## 2018-08-18 VITALS — BP 135/62 | HR 79 | Temp 97.9°F | Resp 18 | Ht 64.0 in | Wt 118.0 lb

## 2018-08-18 DIAGNOSIS — M545 Low back pain: Secondary | ICD-10-CM | POA: Diagnosis present

## 2018-08-18 DIAGNOSIS — G894 Chronic pain syndrome: Secondary | ICD-10-CM

## 2018-08-18 DIAGNOSIS — B0229 Other postherpetic nervous system involvement: Secondary | ICD-10-CM

## 2018-08-18 DIAGNOSIS — M792 Neuralgia and neuritis, unspecified: Secondary | ICD-10-CM

## 2018-08-18 DIAGNOSIS — Z833 Family history of diabetes mellitus: Secondary | ICD-10-CM | POA: Insufficient documentation

## 2018-08-18 DIAGNOSIS — M961 Postlaminectomy syndrome, not elsewhere classified: Secondary | ICD-10-CM | POA: Diagnosis not present

## 2018-08-18 DIAGNOSIS — F17210 Nicotine dependence, cigarettes, uncomplicated: Secondary | ICD-10-CM | POA: Diagnosis not present

## 2018-08-18 DIAGNOSIS — M79605 Pain in left leg: Secondary | ICD-10-CM | POA: Diagnosis present

## 2018-08-18 DIAGNOSIS — Z9889 Other specified postprocedural states: Secondary | ICD-10-CM

## 2018-08-18 MED ORDER — GABAPENTIN 100 MG PO CAPS: ORAL_CAPSULE | ORAL | 0 refills | Status: DC

## 2018-08-18 NOTE — Patient Instructions (Signed)
Today we discussed the following: 1.  Please obtain MRI results from Dr. Trenton Gammon.  This can be faxed to our clinic.  Please have front desk staff coordinate this 2.  Continue prednisone taper 3.  Increase gabapentin as prescribed at 300 mg nightly.  If any side effects at this dose decrease back down to 200 mg nightly. 4.  UDS today 5.  Consideration of left L5-S1 epidural steroid injection 5.  Consideration of lumbar spinal cord stimulator trial
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
## 2018-08-18 NOTE — Progress Notes (Signed)
Safety precautions to be maintained throughout the outpatient stay will include: orient to surroundings, keep bed in low position, maintain call bell within reach at all times, provide assistance with transfer out of bed and ambulation.  

## 2018-08-25 LAB — COMPLIANCE DRUG ANALYSIS, UR

## 2018-09-09 ENCOUNTER — Other Ambulatory Visit (HOSPITAL_COMMUNITY): Payer: Self-pay | Admitting: Internal Medicine

## 2018-09-09 DIAGNOSIS — Z1231 Encounter for screening mammogram for malignant neoplasm of breast: Secondary | ICD-10-CM

## 2018-09-15 ENCOUNTER — Encounter: Payer: Medicaid Other | Admitting: Student in an Organized Health Care Education/Training Program

## 2018-10-03 ENCOUNTER — Ambulatory Visit (HOSPITAL_COMMUNITY)
Admission: RE | Admit: 2018-10-03 | Discharge: 2018-10-03 | Disposition: A | Discharge status: Home / Self Care | Payer: Medicaid Other | Source: Ambulatory Visit | Attending: Internal Medicine | Admitting: Internal Medicine

## 2018-10-03 ENCOUNTER — Encounter (HOSPITAL_COMMUNITY): Payer: Self-pay

## 2018-10-03 DIAGNOSIS — Z1231 Encounter for screening mammogram for malignant neoplasm of breast: Secondary | ICD-10-CM | POA: Diagnosis present

## 2018-10-13 ENCOUNTER — Other Ambulatory Visit: Payer: Self-pay

## 2018-10-13 ENCOUNTER — Ambulatory Visit
Payer: Medicaid Other | Attending: Student in an Organized Health Care Education/Training Program | Admitting: Student in an Organized Health Care Education/Training Program

## 2018-10-13 ENCOUNTER — Encounter: Payer: Self-pay | Admitting: Student in an Organized Health Care Education/Training Program

## 2018-10-13 VITALS — BP 111/68 | HR 82 | Temp 98.3°F | Resp 16 | Ht 63.0 in | Wt 118.0 lb

## 2018-10-13 DIAGNOSIS — M961 Postlaminectomy syndrome, not elsewhere classified: Secondary | ICD-10-CM

## 2018-10-13 DIAGNOSIS — G894 Chronic pain syndrome: Secondary | ICD-10-CM

## 2018-10-13 DIAGNOSIS — B0229 Other postherpetic nervous system involvement: Secondary | ICD-10-CM | POA: Diagnosis present

## 2018-10-13 DIAGNOSIS — Z9889 Other specified postprocedural states: Secondary | ICD-10-CM | POA: Insufficient documentation

## 2018-10-13 DIAGNOSIS — M5416 Radiculopathy, lumbar region: Secondary | ICD-10-CM | POA: Insufficient documentation

## 2018-10-13 DIAGNOSIS — M792 Neuralgia and neuritis, unspecified: Secondary | ICD-10-CM | POA: Diagnosis present

## 2018-10-13 MED ORDER — PREGABALIN 50 MG PO CAPS: ORAL_CAPSULE | ORAL | 1 refills | Status: DC

## 2018-10-13 NOTE — Progress Notes (Signed)
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 soreness.       3. Damage to internal structures:  The nerves to be lesioned may be near blood vessels or    other nerves which can be potentially damaged.       4. Bleeding: Bleeding is more common if the patient is taking blood thinners such as  aspirin, Coumadin, Ticiid, Plavix, etc., or if he/she have some genetic predisposition  such as hemophilia. Bleeding into the spinal canal can cause compression of the spinal  cord with subsequent paralysis.  This would require an emergency surgery to  decompress and there are no guarantees that the patient would recover from the  paralysis.       5. Pneumothorax:  Puncturing of a lung is a possibility, every time a needle is introduced in  the area of  the chest or upper back.  Pneumothorax refers to free air around the  collapsed lung(s), inside of the thoracic cavity (chest cavity).  Another two possible  complications related to a similar event would include: Hemothorax and Chylothorax.   These are variations of the Pneumothorax, where instead of air around the collapsed  lung(s), you may have blood or chyle, respectively.       6. Spinal headaches: They may occur with any procedures in the area of the spine.       7. Persistent CSF (Cerebro-Spinal Fluid) leakage: This is a rare problem, but may occur  with prolonged intrathecal or epidural catheters either due to the formation of a fistulous  track or a dural tear.       8. Nerve damage: By working so close to the spinal cord, there is always a possibility of  nerve damage, which could be as serious as a permanent spinal cord injury with  paralysis.       9. Death:  Although rare, severe deadly allergic reactions known as "Anaphylactic  reaction" can occur to any of the medications used.      10. Worsening of the symptoms:  We can always make thing worse.  What are the chances of something like this happening? Chances of any of this occuring are extremely low.  By statistics, you have more of a chance of getting killed in a motor vehicle accident: while driving to the hospital than any of the above occurring .  Nevertheless, you should be aware that they are possibilities.  In general, it is similar to taking a shower.  Everybody knows that you can slip, hit your head and get killed.  Does that mean that you should not shower again?  Nevertheless always keep in mind that statistics do not mean anything if you happen  to be on the wrong side of them.  Even if a procedure has a 1 (one) in a 1,000,000 (million) chance of going wrong, it you happen to be that one..Also, keep in mind that by statistics, you have more of a chance of having something go wrong when taking medications.  Who should not have  this procedure? If you are on a blood thinning medication (e.g. Coumadin, Plavix, see list of "Blood Thinners"), or if you have an active infection going on, you should not have the procedure.  If you are taking any blood thinners, please inform your physician.  How should I prepare for this procedure?  Do not eat or drink anything at least six hours prior to the procedure.  Bring a driver with you .  It cannot be a taxi.  Come accompanied by an adult that can drive you back, and that is strong enough to help you if your legs get weak or numb from the local anesthetic.  Take all of your medicines the morning of the procedure with just enough water to swallow them.  If you have diabetes, make sure that you are scheduled to have your procedure done first thing in the morning, whenever possible.  If you have diabetes, take only half of your insulin dose and notify our nurse that you have done so as soon as you arrive at the clinic.  If you are diabetic, but only take blood sugar pills (oral hypoglycemic), then do not take them on the morning of your procedure.  You may take them after you have had the procedure.  Do not take aspirin or any aspirin-containing medications, at least eleven (11) days prior to the procedure.  They may prolong bleeding.  Wear loose fitting clothing that may be easy to take off and that you would not mind if it got stained with Betadine or blood.  Do not wear any jewelry or perfume  Remove any nail coloring.  It will interfere with some of our monitoring equipment.  NOTE: Remember that this is not meant to be interpreted as a complete list of all possible complications.  Unforeseen problems may occur.  BLOOD THINNERS The following drugs contain aspirin or other products, which can cause increased bleeding during surgery and should not be taken for 2 weeks prior to and 1 week after surgery.  If you should need take something for relief of minor pain, you may take  acetaminophen which is found in Tylenol,m Datril, Anacin-3 and Panadol. It is not blood thinner. The products listed below are.  Do not take any of the products listed below in addition to any listed on your instruction sheet.  A.P.C or A.P.C with Codeine Codeine Phosphate Capsules #3 Ibuprofen Ridaura  ABC compound Congesprin Imuran rimadil  Advil Cope Indocin Robaxisal  Alka-Seltzer Effervescent Pain Reliever and Antacid Coricidin or Coricidin-D  Indomethacin Rufen  Alka-Seltzer plus Cold Medicine Cosprin Ketoprofen S-A-C Tablets  Anacin Analgesic Tablets or Capsules Coumadin Korlgesic Salflex  Anacin Extra Strength Analgesic tablets or capsules CP-2 Tablets Lanoril Salicylate  Anaprox Cuprimine Capsules Levenox Salocol  Anexsia-D Dalteparin Magan Salsalate  Anodynos Darvon compound Magnesium Salicylate Sine-off  Ansaid Dasin Capsules Magsal Sodium Salicylate  Anturane Depen Capsules Marnal Soma  APF Arthritis pain formula Dewitt's Pills Measurin Stanback  Argesic Dia-Gesic Meclofenamic Sulfinpyrazone  Arthritis Bayer Timed Release Aspirin Diclofenac Meclomen Sulindac  Arthritis pain formula Anacin Dicumarol Medipren Supac  Analgesic (Safety coated) Arthralgen Diffunasal Mefanamic Suprofen  Arthritis Strength Bufferin Dihydrocodeine Mepro  Compound Suprol  Arthropan liquid Dopirydamole Methcarbomol with Aspirin Synalgos  ASA tablets/Enseals Disalcid Micrainin Tagament  Ascriptin Doan's Midol Talwin  Ascriptin A/D Dolene Mobidin Tanderil  Ascriptin Extra Strength Dolobid Moblgesic Ticlid  Ascriptin with Codeine Doloprin or Doloprin with Codeine Momentum Tolectin  Asperbuf Duoprin Mono-gesic Trendar  Aspergum Duradyne Motrin or Motrin IB Triminicin  Aspirin plain, buffered or enteric coated Durasal Myochrisine Trigesic  Aspirin Suppositories Easprin Nalfon Trillsate  Aspirin with Codeine Ecotrin Regular or Extra Strength Naprosyn Uracel  Atromid-S Efficin Naproxen Ursinus  Auranofin  Capsules Elmiron Neocylate Vanquish  Axotal Emagrin Norgesic Verin  Azathioprine Empirin or Empirin with Codeine Normiflo Vitamin E  Azolid Emprazil Nuprin Voltaren  Bayer Aspirin plain, buffered or children's or timed BC Tablets or powders Encaprin Orgaran Warfarin Sodium  Buff-a-Comp Enoxaparin Orudis Zorpin  Buff-a-Comp with Codeine Equegesic Os-Cal-Gesic   Buffaprin Excedrin plain, buffered or Extra Strength Oxalid   Bufferin Arthritis Strength Feldene Oxphenbutazone   Bufferin plain or Extra Strength Feldene Capsules Oxycodone with Aspirin   Bufferin with Codeine Fenoprofen Fenoprofen Pabalate or Pabalate-SF   Buffets II Flogesic Panagesic   Buffinol plain or Extra Strength Florinal or Florinal with Codeine Panwarfarin   Buf-Tabs Flurbiprofen Penicillamine   Butalbital Compound Four-way cold tablets Penicillin   Butazolidin Fragmin Pepto-Bismol   Carbenicillin Geminisyn Percodan   Carna Arthritis Reliever Geopen Persantine   Carprofen Gold's salt Persistin   Chloramphenicol Goody's Phenylbutazone   Chloromycetin Haltrain Piroxlcam   Clmetidine heparin Plaquenil   Cllnoril Hyco-pap Ponstel   Clofibrate Hydroxy chloroquine Propoxyphen         Before stopping any of these medications, be sure to consult the physician who ordered them.  Some, such as Coumadin (Warfarin) are ordered to prevent or treat serious conditions such as "deep thrombosis", "pumonary embolisms", and other heart problems.  The amount of time that you may need off of the medication may also vary with the medication and the reason for which you were taking it.  If you are taking any of these medications, please make sure you notify your pain physician before you undergo any procedures.   Epidural Steroid Injection Patient Information  Description: The epidural space surrounds the nerves as they exit the spinal cord.  In some patients, the nerves can be compressed and inflamed by a bulging disc or a tight spinal  canal (spinal stenosis).  By injecting steroids into the epidural space, we can bring irritated nerves into direct contact with a potentially helpful medication.  These steroids act directly on the irritated nerves and can reduce swelling and inflammation which often leads to decreased pain.  Epidural steroids may be injected anywhere along the spine and from the neck to the low back depending upon the location of your pain.   After numbing the skin with local anesthetic (like Novocaine), a small needle is passed into the epidural space slowly.  You may experience a sensation of pressure while this is being done.  The entire block usually last less than 10 minutes.  Conditions which may be treated by epidural steroids:   Low back and leg pain  Neck and arm pain  Spinal stenosis  Post-laminectomy syndrome  Herpes zoster (shingles) pain  Pain from compression fractures  Preparation for the injection:  1. Do not eat any solid food or dairy products within 8 hours of your appointment.  2. You may drink clear liquids up to 3 hours before appointment.  Clear liquids include water, black coffee, juice or soda.  No milk or cream please. 3. You may take your regular medication, including pain medications, with a sip of water before your appointment  Diabetics should hold regular insulin (if taken separately) and take 1/2 normal NPH dos the morning of the procedure.  Carry some sugar containing items with you to your appointment. 4. A driver must accompany you and be prepared to drive you home after your procedure.  5. Bring all your current medications with your. 6. An IV may be inserted and sedation may be given at the discretion of the physician.   7. A blood pressure cuff, EKG and other monitors will often be applied during the procedure.  Some patients may need to have extra oxygen administered for a short period. 8. You will be asked to provide medical information, including your allergies,  prior to the procedure.  We must know immediately if you are taking blood thinners (like Coumadin/Warfarin)  Or if you are allergic to IV iodine contrast (dye). We must know if you could possible be pregnant.  Possible side-effects:  Bleeding from needle site  Infection (rare, may require surgery)  Nerve injury (rare)  Numbness & tingling (temporary)  Difficulty urinating (rare, temporary)  Spinal headache ( a headache worse with upright posture)  Light -headedness (temporary)  Pain at injection site (several days)  Decreased blood pressure (temporary)  Weakness in arm/leg (temporary)  Pressure sensation in back/neck (temporary)  Call if you experience:  Fever/chills associated with headache or increased back/neck pain.  Headache worsened by an upright position.  New onset weakness or numbness of an extremity below the injection site  Hives or difficulty breathing (go to the emergency room)  Inflammation or drainage at the infection site  Severe back/neck pain  Any new symptoms which are concerning to you  Please note:  Although the local anesthetic injected can often make your back or neck feel good for several hours after the injection, the pain will likely return.  It takes 3-7 days for steroids to work in the epidural space.  You may not notice any pain relief for at least that one week.  If effective, we will often do a series of three injections spaced 3-6 weeks apart to maximally decrease your pain.  After the initial series, we generally will wait several months before considering a repeat injection of the same type.  If you have any questions, please call [phone] [facility]

## 2018-10-13 NOTE — Progress Notes (Signed)
Safety precautions to be maintained throughout the outpatient stay will include: orient to surroundings, keep bed in low position, maintain call bell within reach at all times, provide assistance with transfer out of bed and ambulation.  

## 2018-10-13 NOTE — Patient Instructions (Signed)
Lyrica has been escribed to your pharmacy.   Moderate Conscious Sedation, Adult Sedation is the use of medicines to promote relaxation and relieve discomfort and anxiety. Moderate conscious sedation is a type of sedation. Under moderate conscious sedation, you are less alert than normal, but you are still able to respond to instructions, touch, or both. Moderate conscious sedation is used during short medical and dental procedures. It is milder than deep sedation, which is a type of sedation under which you cannot be easily woken up. It is also milder than general anesthesia, which is the use of medicines to make you unconscious. Moderate conscious sedation allows you to return to your regular activities sooner. Tell a health care provider about:  Any allergies you have.  All medicines you are taking, including vitamins, herbs, eye drops, creams, and over-the-counter medicines.  Use of steroids (by mouth or creams).  Any problems you or family members have had with sedatives and anesthetic medicines.  Any blood disorders you have.  Any surgeries you have had.  Any medical conditions you have, such as sleep apnea.  Whether you are pregnant or may be pregnant.  Any use of cigarettes, alcohol, marijuana, or street drugs. What are the risks? Generally, this is a safe procedure. However, problems may occur, including:  Getting too much medicine (oversedation).  Nausea.  Allergic reaction to medicines.  Trouble breathing. If this happens, a breathing tube may be used to help with breathing. It will be removed when you are awake and breathing on your own.  Heart trouble.  Lung trouble. What happens before the procedure? Staying hydrated Follow instructions from your health care provider about hydration, which may include:  Up to 2 hours before the procedure - you may continue to drink clear liquids, such as water, clear fruit juice, black coffee, and plain tea. Eating and  drinking restrictions Follow instructions from your health care provider about eating and drinking, which may include:  8 hours before the procedure - stop eating heavy meals or foods such as meat, fried foods, or fatty foods.  6 hours before the procedure - stop eating light meals or foods, such as toast or cereal.  6 hours before the procedure - stop drinking milk or drinks that contain milk.  2 hours before the procedure - stop drinking clear liquids. Medicine Ask your health care provider about:  Changing or stopping your regular medicines. This is especially important if you are taking diabetes medicines or blood thinners.  Taking medicines such as aspirin and ibuprofen. These medicines can thin your blood. Do not take these medicines before your procedure if your health care provider instructs you not to.  Tests and exams  You will have a physical exam.  You may have blood tests done to show: ? How well your kidneys and liver are working. ? How well your blood can clot. General instructions  Plan to have someone take you home from the hospital or clinic.  If you will be going home right after the procedure, plan to have someone with you for 24 hours. What happens during the procedure?  An IV tube will be inserted into one of your veins.  Medicine to help you relax (sedative) will be given through the IV tube.  The medical or dental procedure will be performed. What happens after the procedure?  Your blood pressure, heart rate, breathing rate, and blood oxygen level will be monitored often until the medicines you were given have worn off.  Do  not drive for 24 hours. This information is not intended to replace advice given to you by your health care provider. Make sure you discuss any questions you have with your health care provider. Document Released: 06/16/2001 Document Revised: 02/25/2016 Document Reviewed: 01/11/2016 Elsevier Interactive Patient Education  2019  [place]  What are the risk, side effects and possible complications? Generally speaking, most procedures are safe.  However, with any procedure there are risks, side effects, and the possibility of complications.  The risks and complications are dependent upon the sites that are lesioned, or the type of nerve block to be performed.  The closer the procedure is to the spine, the more serious the risks are.  Great care is taken when placing the radio frequency needles, block needles or lesioning probes, but sometimes complications can occur. 1. Infection: Any time there is an injection through the skin, there is a risk of infection.  This is why sterile conditions are used for these blocks.  There are four possible types of infection. 1. Localized skin infection. 2. Central Nervous System Infection-This can be in the form of Meningitis, which can be deadly. 3. Epidural Infections-This can be in the form of an epidural abscess, which can cause pressure inside of the spine, causing compression of the spinal cord with subsequent paralysis. This would require an emergency surgery to decompress, and there are no guarantees that the patient would recover from the paralysis. 4. Discitis-This is an infection of the intervertebral discs.  It occurs in about 1% of discography procedures.  It is difficult to treat and it may lead to surgery.        2. Pain: the needles have to go through skin and soft tissues, will cause soreness.       3. Damage to internal structures:  The nerves to be lesioned may be near blood vessels or    other nerves which can be potentially damaged.       4. Bleeding: Bleeding is more common if the patient is taking blood thinners such as  aspirin, Coumadin, Ticiid, Plavix, etc., or if he/she have some genetic predisposition  such as hemophilia. Bleeding into the spinal canal can cause compression of the spinal  cord with subsequent paralysis.  This  would require an emergency surgery to  decompress and there are no guarantees that the patient would recover from the  paralysis.       5. Pneumothorax:  Puncturing of a lung is a possibility, every time a needle is introduced in  the area of the chest or upper back.  Pneumothorax refers to free air around the  collapsed lung(s), inside of the thoracic cavity (chest cavity).  Another two possible  complications related to a similar event would include: Hemothorax and Chylothorax.   These are variations of the Pneumothorax, where instead of air around the collapsed  lung(s), you may have blood or chyle, respectively.       6. Spinal headaches: They may occur with any procedures in the area of the spine.       7. Persistent CSF (Cerebro-Spinal Fluid) leakage: This is a rare problem, but may occur  with prolonged intrathecal or epidural catheters either due to the formation of a fistulous  track or a dural tear.       8. Nerve damage: By working so close to the spinal cord, there is always a possibility of  nerve damage, which could be as serious  as a permanent spinal cord injury with  paralysis.       9. Death:  Although rare, severe deadly allergic reactions known as "Anaphylactic  reaction" can occur to any of the medications used.      10. Worsening of the symptoms:  We can always make thing worse.  What are the chances of something like this happening? Chances of any of this occuring are extremely low.  By statistics, you have more of a chance of getting killed in a motor vehicle accident: while driving to the hospital than any of the above occurring .  Nevertheless, you should be aware that they are possibilities.  In general, it is similar to taking a shower.  Everybody knows that you can slip, hit your head and get killed.  Does that mean that you should not shower again?  Nevertheless always keep in mind that statistics do not mean anything if you happen to be on the wrong side of them.  Even if a  procedure has a 1 (one) in a 1,000,000 (million) chance of going wrong, it you happen to be that one..Also, keep in mind that by statistics, you have more of a chance of having something go wrong when taking medications.  Who should not have this procedure? If you are on a blood thinning medication (e.g. Coumadin, Plavix, see list of "Blood Thinners"), or if you have an active infection going on, you should not have the procedure.  If you are taking any blood thinners, please inform your physician.  How should I prepare for this procedure?  Do not eat or drink anything at least six hours prior to the procedure.  Bring a driver with you .  It cannot be a taxi.  Come accompanied by an adult that can drive you back, and that is strong enough to help you if your legs get weak or numb from the local anesthetic.  Take all of your medicines the morning of the procedure with just enough water to swallow them.  If you have diabetes, make sure that you are scheduled to have your procedure done first thing in the morning, whenever possible.  If you have diabetes, take only half of your insulin dose and notify our nurse that you have done so as soon as you arrive at the clinic.  If you are diabetic, but only take blood sugar pills (oral hypoglycemic), then do not take them on the morning of your procedure.  You may take them after you have had the procedure.  Do not take aspirin or any aspirin-containing medications, at least eleven (11) days prior to the procedure.  They may prolong bleeding.  Wear loose fitting clothing that may be easy to take off and that you would not mind if it got stained with Betadine or blood.  Do not wear any jewelry or perfume  Remove any nail coloring.  It will interfere with some of our monitoring equipment.  NOTE: Remember that this is not meant to be interpreted as a complete list of all possible complications.  Unforeseen problems may occur.  BLOOD THINNERS The  following drugs contain aspirin or other products, which can cause increased bleeding during surgery and should not be taken for 2 weeks prior to and 1 week after surgery.  If you should need take something for relief of minor pain, you may take acetaminophen which is found in Tylenol,m Datril, Anacin-3 and Panadol. It is not blood thinner. The products listed below are.  Do not take any of the products listed below in addition to any listed on your instruction sheet.  A.P.C or A.P.C with Codeine Codeine Phosphate Capsules #3 Ibuprofen Ridaura  ABC compound Congesprin Imuran rimadil  Advil Cope Indocin Robaxisal  Alka-Seltzer Effervescent Pain Reliever and Antacid Coricidin or Coricidin-D  Indomethacin Rufen  Alka-Seltzer plus Cold Medicine Cosprin Ketoprofen S-A-C Tablets  Anacin Analgesic Tablets or Capsules Coumadin Korlgesic Salflex  Anacin Extra Strength Analgesic tablets or capsules CP-2 Tablets Lanoril Salicylate  Anaprox Cuprimine Capsules Levenox Salocol  Anexsia-D Dalteparin Magan Salsalate  Anodynos Darvon compound Magnesium Salicylate Sine-off  Ansaid Dasin Capsules Magsal Sodium Salicylate  Anturane Depen Capsules Marnal Soma  APF Arthritis pain formula Dewitt's Pills Measurin Stanback  Argesic Dia-Gesic Meclofenamic Sulfinpyrazone  Arthritis Bayer Timed Release Aspirin Diclofenac Meclomen Sulindac  Arthritis pain formula Anacin Dicumarol Medipren Supac  Analgesic (Safety coated) Arthralgen Diffunasal Mefanamic Suprofen  Arthritis Strength Bufferin Dihydrocodeine Mepro Compound Suprol  Arthropan liquid Dopirydamole Methcarbomol with Aspirin Synalgos  ASA tablets/Enseals Disalcid Micrainin Tagament  Ascriptin Doan's Midol Talwin  Ascriptin A/D Dolene Mobidin Tanderil  Ascriptin Extra Strength Dolobid Moblgesic Ticlid  Ascriptin with Codeine Doloprin or Doloprin with Codeine Momentum Tolectin  Asperbuf Duoprin Mono-gesic Trendar  Aspergum Duradyne Motrin or Motrin IB Triminicin   Aspirin plain, buffered or enteric coated Durasal Myochrisine Trigesic  Aspirin Suppositories Easprin Nalfon Trillsate  Aspirin with Codeine Ecotrin Regular or Extra Strength Naprosyn Uracel  Atromid-S Efficin Naproxen Ursinus  Auranofin Capsules Elmiron Neocylate Vanquish  Axotal Emagrin Norgesic Verin  Azathioprine Empirin or Empirin with Codeine Normiflo Vitamin E  Azolid Emprazil Nuprin Voltaren  Bayer Aspirin plain, buffered or children's or timed BC Tablets or powders Encaprin Orgaran Warfarin Sodium  Buff-a-Comp Enoxaparin Orudis Zorpin  Buff-a-Comp with Codeine Equegesic Os-Cal-Gesic   Buffaprin Excedrin plain, buffered or Extra Strength Oxalid   Bufferin Arthritis Strength Feldene Oxphenbutazone   Bufferin plain or Extra Strength Feldene Capsules Oxycodone with Aspirin   Bufferin with Codeine Fenoprofen Fenoprofen Pabalate or Pabalate-SF   Buffets II Flogesic Panagesic   Buffinol plain or Extra Strength Florinal or Florinal with Codeine Panwarfarin   Buf-Tabs Flurbiprofen Penicillamine   Butalbital Compound Four-way cold tablets Penicillin   Butazolidin Fragmin Pepto-Bismol   Carbenicillin Geminisyn Percodan   Carna Arthritis Reliever Geopen Persantine   Carprofen Gold's salt Persistin   Chloramphenicol Goody's Phenylbutazone   Chloromycetin Haltrain Piroxlcam   Clmetidine heparin Plaquenil   Cllnoril Hyco-pap Ponstel   Clofibrate Hydroxy chloroquine Propoxyphen         Before stopping any of these medications, be sure to consult the physician who ordered them.  Some, such as Coumadin (Warfarin) are ordered to prevent or treat serious conditions such as "deep thrombosis", "pumonary embolisms", and other heart problems.  The amount of time that you may need off of the medication may also vary with the medication and the reason for which you were taking it.  If you are taking any of these medications, please make sure you notify your pain physician before you undergo any  procedures.   Epidural Steroid Injection Patient Information  Description: The epidural space surrounds the nerves as they exit the spinal cord.  In some patients, the nerves can be compressed and inflamed by a bulging disc or a tight spinal canal (spinal stenosis).  By injecting steroids into the epidural space, we can bring irritated nerves into direct contact with a potentially helpful medication.  These steroids act directly on the irritated nerves and can reduce swelling  and inflammation which often leads to decreased pain.  Epidural steroids may be injected anywhere along the spine and from the neck to the low back depending upon the location of your pain.   After numbing the skin with local anesthetic (like Novocaine), a small needle is passed into the epidural space slowly.  You may experience a sensation of pressure while this is being done.  The entire block usually last less than 10 minutes.  Conditions which may be treated by epidural steroids:   Low back and leg pain  Neck and arm pain  Spinal stenosis  Post-laminectomy syndrome  Herpes zoster (shingles) pain  Pain from compression fractures  Preparation for the injection:  1. Do not eat any solid food or dairy products within 8 hours of your appointment.  2. You may drink clear liquids up to 3 hours before appointment.  Clear liquids include water, black coffee, juice or soda.  No milk or cream please. 3. You may take your regular medication, including pain medications, with a sip of water before your appointment  Diabetics should hold regular insulin (if taken separately) and take 1/2 normal NPH dos the morning of the procedure.  Carry some sugar containing items with you to your appointment. 4. A driver must accompany you and be prepared to drive you home after your procedure.  5. Bring all your current medications with your. 6. An IV may be inserted and sedation may be given at the discretion of the physician.   7. A  blood pressure cuff, EKG and other monitors will often be applied during the procedure.  Some patients may need to have extra oxygen administered for a short period. 8. You will be asked to provide medical information, including your allergies, prior to the procedure.  We must know immediately if you are taking blood thinners (like Coumadin/Warfarin)  Or if you are allergic to IV iodine contrast (dye). We must know if you could possible be pregnant.  Possible side-effects:  Bleeding from needle site  Infection (rare, may require surgery)  Nerve injury (rare)  Numbness & tingling (temporary)  Difficulty urinating (rare, temporary)  Spinal headache ( a headache worse with upright posture)  Light -headedness (temporary)  Pain at injection site (several days)  Decreased blood pressure (temporary)  Weakness in arm/leg (temporary)  Pressure sensation in back/neck (temporary)  Call if you experience:  Fever/chills associated with headache or increased back/neck pain.  Headache worsened by an upright position.  New onset weakness or numbness of an extremity below the injection site  Hives or difficulty breathing (go to the emergency room)  Inflammation or drainage at the infection site  Severe back/neck pain  Any new symptoms which are concerning to you  Please note:  Although the local anesthetic injected can often make your back or neck feel good for several hours after the injection, the pain will likely return.  It takes 3-7 days for steroids to work in the epidural space.  You may not notice any pain relief for at least that one week.  If effective, we will often do a series of three injections spaced 3-6 weeks apart to maximally decrease your pain.  After the initial series, we generally will wait several months before considering a repeat injection of the same type.  If you have any questions, please call [phone] [facility]

## 2018-10-24 ENCOUNTER — Other Ambulatory Visit: Payer: Self-pay

## 2018-10-24 ENCOUNTER — Encounter: Payer: Self-pay | Admitting: Student in an Organized Health Care Education/Training Program

## 2018-10-24 ENCOUNTER — Ambulatory Visit (HOSPITAL_BASED_OUTPATIENT_CLINIC_OR_DEPARTMENT_OTHER): Payer: Medicaid Other | Admitting: Student in an Organized Health Care Education/Training Program

## 2018-10-24 ENCOUNTER — Ambulatory Visit
Admission: RE | Admit: 2018-10-24 | Discharge: 2018-10-24 | Disposition: A | Discharge status: Home / Self Care | Payer: Medicaid Other | Source: Ambulatory Visit | Attending: Student in an Organized Health Care Education/Training Program | Admitting: Student in an Organized Health Care Education/Training Program

## 2018-10-24 VITALS — BP 121/81 | HR 66 | Temp 98.4°F | Resp 20 | Ht 63.5 in | Wt 118.0 lb

## 2018-10-24 DIAGNOSIS — M5416 Radiculopathy, lumbar region: Secondary | ICD-10-CM

## 2018-10-24 MED ORDER — LIDOCAINE HCL 2 % IJ SOLN
INTRAMUSCULAR | Status: AC
Start: 2018-10-24 — End: ?
  Filled 2018-10-24: qty 20

## 2018-10-24 MED ORDER — LIDOCAINE HCL 2 % IJ SOLN
10.0000 mL | Freq: Once | INTRAMUSCULAR | Status: AC
  Administered 2018-10-24: 200 mg

## 2018-10-24 MED ORDER — SODIUM CHLORIDE 0.9% FLUSH
2.0000 mL | Freq: Once | INTRAVENOUS | Status: AC
  Administered 2018-10-24: 2 mL

## 2018-10-24 MED ORDER — SODIUM CHLORIDE (PF) 0.9 % IJ SOLN
INTRAMUSCULAR | Status: AC
  Filled 2018-10-24: qty 10

## 2018-10-24 MED ORDER — ROPIVACAINE HCL 2 MG/ML IJ SOLN
2.0000 mL | Freq: Once | INTRAMUSCULAR | Status: AC
  Administered 2018-10-24: 2 mL via EPIDURAL

## 2018-10-24 MED ORDER — DEXAMETHASONE SODIUM PHOSPHATE 10 MG/ML IJ SOLN
10.0000 mg | Freq: Once | INTRAMUSCULAR | Status: AC
  Administered 2018-10-24: 10 mg

## 2018-10-24 MED ORDER — IOPAMIDOL (ISOVUE-M 200) INJECTION 41%
10.0000 mL | Freq: Once | INTRAMUSCULAR | Status: AC
  Administered 2018-10-24: 10 mL via EPIDURAL
  Filled 2018-10-24: qty 10

## 2018-10-24 MED ORDER — DEXAMETHASONE SODIUM PHOSPHATE 10 MG/ML IJ SOLN
INTRAMUSCULAR | Status: AC
  Filled 2018-10-24: qty 1

## 2018-10-24 MED ORDER — ROPIVACAINE HCL 2 MG/ML IJ SOLN
INTRAMUSCULAR | Status: AC
  Filled 2018-10-24: qty 10

## 2018-10-24 NOTE — Progress Notes (Signed)
Patient's Name: ONYINYECHI HUANTE  MRN: [id number]  Referring Provider: Abran Richard, MD  DOB: 1960/12/16  PCP: Abran Richard, MD  DOS: 10/24/2018  Note by: Gillis Santa, MD  Service setting: Ambulatory outpatient  Specialty: Interventional Pain Management  Patient type: Established  Location: [facility] (AMB) Pain Management Facility  Visit type: Interventional Procedure   Primary Reason for Visit: Interventional Pain Management Treatment. CC: Back Pain (lower)  Procedure:          Anesthesia, Analgesia, Anxiolysis:  Type: Therapeutic Inter-Laminar Epidural Steroid Injection  #1  Region: Lumbar Level: L3-4 Level. Laterality: Left-Sided         Type: Local Anesthesia Indication(s): Analgesia         Route: Infiltration (/IM) IV Access: Declined Sedation: Declined  Local Anesthetic: Lidocaine 1-2%  Position: Prone with head of the table was raised to facilitate breathing.   Indications: 1. Lumbar radiculopathy    Pain Score: Pre-procedure: 5 /10 Post-procedure: 5 /10  Pre-op Assessment:  Ms. Kovich is a 58 y.o. (year old), female patient, seen today for interventional treatment. She  has a past surgical history that includes Colonoscopy (N/A, 06/19/2016); Back surgery; Fracture surgery (Left); and Lumbar laminectomy/decompression microdiscectomy (Left, 12/30/2016). Ms. Werk has a current medication list which includes the following prescription(s): acetaminophen, hydrocodone-acetaminophen, ibuprofen, and pregabalin. Her primarily concern today is the Back Pain (lower)  Initial Vital Signs:  Pulse/HCG Rate: 66ECG Heart Rate: 60 Temp: 98.4 F (36.9 C) Resp: 16 BP: 123/83 SpO2: 100 %  BMI: Estimated body mass index is 20.57 kg/m as calculated from the following:   Height as of this encounter: 5' 3.5" (1.613 m).   Weight as of this encounter: 118 lb (53.5 kg).  Risk Assessment: Allergies: Reviewed. She is allergic to no known allergies.  Allergy Precautions: None  required Coagulopathies: Reviewed. None identified.  Blood-thinner therapy: None at this time Active Infection(s): Reviewed. None identified. Ms. Hanger is afebrile  Site Confirmation: Ms. Zentz was asked to confirm the procedure and laterality before marking the site Procedure checklist: Completed Consent: Before the procedure and under the influence of no sedative(s), amnesic(s), or anxiolytics, the patient was informed of the treatment options, risks and possible complications. To fulfill our ethical and legal obligations, as recommended by the American Medical Association's Code of Ethics, I have informed the patient of my clinical impression; the nature and purpose of the treatment or procedure; the risks, benefits, and possible complications of the intervention; the alternatives, including doing nothing; the risk(s) and benefit(s) of the alternative treatment(s) or procedure(s); and the risk(s) and benefit(s) of doing nothing. The patient was provided information about the general risks and possible complications associated with the procedure. These may include, but are not limited to: failure to achieve desired goals, infection, bleeding, organ or nerve damage, allergic reactions, paralysis, and death. In addition, the patient was informed of those risks and complications associated to Spine-related procedures, such as failure to decrease pain; infection (i.e.: Meningitis, epidural or intraspinal abscess); bleeding (i.e.: epidural hematoma, subarachnoid hemorrhage, or any other type of intraspinal or peri-dural bleeding); organ or nerve damage (i.e.: Any type of peripheral nerve, nerve root, or spinal cord injury) with subsequent damage to sensory, motor, and/or autonomic systems, resulting in permanent pain, numbness, and/or weakness of one or several areas of the body; allergic reactions; (i.e.: anaphylactic reaction); and/or death. Furthermore, the patient was informed of those risks and  complications associated with the medications. These include, but are not limited to: allergic reactions (i.e.: anaphylactic  or anaphylactoid reaction(s)); adrenal axis suppression; blood sugar elevation that in diabetics may result in ketoacidosis or comma; water retention that in patients with history of congestive heart failure may result in shortness of breath, pulmonary edema, and decompensation with resultant heart failure; weight gain; swelling or edema; medication-induced neural toxicity; particulate matter embolism and blood vessel occlusion with resultant organ, and/or nervous system infarction; and/or aseptic necrosis of one or more joints. Finally, the patient was informed that Medicine is not an exact science; therefore, there is also the possibility of unforeseen or unpredictable risks and/or possible complications that may result in a catastrophic outcome. The patient indicated having understood very clearly. We have given the patient no guarantees and we have made no promises. Enough time was given to the patient to ask questions, all of which were answered to the patient's satisfaction. Ms. Pangilinan has indicated that she wanted to continue with the procedure. Attestation: I, the ordering provider, attest that I have discussed with the patient the benefits, risks, side-effects, alternatives, likelihood of achieving goals, and potential problems during recovery for the procedure that I have provided informed consent. Date  Time: 10/24/2018  9:40 AM  Pre-Procedure Preparation:  Monitoring: As per clinic protocol. Respiration, ETCO2, SpO2, BP, heart rate and rhythm monitor placed and checked for adequate function Safety Precautions: Patient was assessed for positional comfort and pressure points before starting the procedure. Time-out: I initiated and conducted the "Time-out" before starting the procedure, as per protocol. The patient was asked to participate by confirming the accuracy of the  "Time Out" information. Verification of the correct person, site, and procedure were performed and confirmed by me, the nursing staff, and the patient. "Time-out" conducted as per Joint Commission's Universal Protocol (UP.01.01.01). Time: 1025  Description of Procedure:          Target Area: The interlaminar space, initially targeting the lower laminar border of the superior vertebral body. Approach: Paramedial approach. Area Prepped: Entire Posterior Lumbar Region Prepping solution: ChloraPrep (2% chlorhexidine gluconate and 70% isopropyl alcohol) Safety Precautions: Aspiration looking for blood return was conducted prior to all injections. At no point did we inject any substances, as a needle was being advanced. No attempts were made at seeking any paresthesias. Safe injection practices and needle disposal techniques used. Medications properly checked for expiration dates. SDV (single dose vial) medications used. Description of the Procedure: Protocol guidelines were followed. The procedure needle was introduced through the skin, ipsilateral to the reported pain, and advanced to the target area. Bone was contacted and the needle walked caudad, until the lamina was cleared. The epidural space was identified using "loss-of-resistance technique" with 2-3 ml of PF-NaCl (0.9% NSS), in a 5cc LOR glass syringe.  Vitals:   10/24/18 1019 10/24/18 1025 10/24/18 1030 10/24/18 1034  BP: 119/79 123/71 128/75 121/81  Pulse:      Resp: 16 20 (!) 22 20  Temp:      TempSrc:      SpO2: 100% 100% 100% 100%  Weight:      Height:        Start Time: 1025 hrs. End Time: 1034 hrs.  Materials:  Needle(s) Type: Epidural needle Gauge: 22G Length: 3.5-in Medication(s): Please see orders for medications and dosing details. 8 cc solution consisting of 5 cc of preservative-free saline, 2 cc of 0.2% ropivacaine, 1 cc of Decadron 10 mg/cc. Imaging Guidance (Spinal):          Type of Imaging Technique: Fluoroscopy  Guidance (Spinal) Indication(s): Assistance in  needle guidance and placement for procedures requiring needle placement in or near specific anatomical locations not easily accessible without such assistance. Exposure Time: Please see nurses notes. Contrast: Before injecting any contrast, we confirmed that the patient did not have an allergy to iodine, shellfish, or radiological contrast. Once satisfactory needle placement was completed at the desired level, radiological contrast was injected. Contrast injected under live fluoroscopy. No contrast complications. See chart for type and volume of contrast used. Fluoroscopic Guidance: I was personally present during the use of fluoroscopy. "Tunnel Vision Technique" used to obtain the best possible view of the target area. Parallax error corrected before commencing the procedure. "Direction-depth-direction" technique used to introduce the needle under continuous pulsed fluoroscopy. Once target was reached, antero-posterior, oblique, and lateral fluoroscopic projection used confirm needle placement in all planes. Images permanently stored in EMR. Interpretation: I personally interpreted the imaging intraoperatively. Adequate needle placement confirmed in multiple planes. Appropriate spread of contrast into desired area was observed. No evidence of afferent or efferent intravascular uptake. No intrathecal or subarachnoid spread observed. Permanent images saved into the patient's record.  Antibiotic Prophylaxis:   Anti-infectives (From admission, onward)   None     Indication(s): None identified  Post-operative Assessment:  Post-procedure Vital Signs:  Pulse/HCG Rate: 6667 Temp: 98.4 F (36.9 C) Resp: 20 BP: 121/81 SpO2: 100 %  EBL: None  Complications: No immediate post-treatment complications observed by team, or reported by patient.  Note: The patient tolerated the entire procedure well. A repeat set of vitals were taken after the procedure and  the patient was kept under observation following institutional policy, for this type of procedure. Post-procedural neurological assessment was performed, showing return to baseline, prior to discharge. The patient was provided with post-procedure discharge instructions, including a section on how to identify potential problems. Should any problems arise concerning this procedure, the patient was given instructions to immediately contact us, at any time, without hesitation. In any case, we plan to contact the patient by telephone for a follow-up status report regarding this interventional procedure.  Comments:  No additional relevant information. 5 out of 5 strength bilateral lower extremity: Plantar flexion, dorsiflexion, knee flexion, knee extension.  Plan of Care   Imaging Orders     DG C-Arm 1-60 Min-No Report Procedure Orders    No procedure(s) ordered today    Medications ordered for procedure: Meds ordered this encounter  Medications  . iopamidol (ISOVUE-M) 41 % intrathecal injection 10 mL  . ropivacaine (PF) 2 mg/mL (0.2%) (NAROPIN) injection 2 mL  . sodium chloride flush (NS) 0.9 % injection 2 mL  . lidocaine (XYLOCAINE) 2 % (with pres) injection 200 mg  . dexamethasone (DECADRON) injection 10 mg   Medications administered: We administered iopamidol, ropivacaine (PF) 2 mg/mL (0.2%), sodium chloride flush, lidocaine, and dexamethasone.  See the medical record for exact dosing, route, and time of administration.  Disposition: Discharge home  Discharge Date & Time: 10/24/2018; 1045 hrs.   Future Appointments  Date Time Provider [place]  11/17/2018 10:15 AM Gillis Santa, MD [facility] None   Primary Care Physician: Abran Richard, MD Location: [facility] Management Facility Note by: Gillis Santa, MD Date: 10/24/2018; Time: 1:17 PM  Disclaimer:  Medicine is not an exact science. The only guarantee in medicine is that nothing is guaranteed. It is important to  note that the decision to proceed with this intervention was based on the information collected from the patient. The Data and conclusions were drawn from the patient's questionnaire,  the interview, and the physical examination. Because the information was provided in large part by the patient, it cannot be guaranteed that it has not been purposely or unconsciously manipulated. Every effort has been made to obtain as much relevant data as possible for this evaluation. It is important to note that the conclusions that lead to this procedure are derived in large part from the available data. Always take into account that the treatment will also be dependent on availability of resources and existing treatment guidelines, considered by other Pain Management Practitioners as being common knowledge and practice, at the time of the intervention. For Medico-Legal purposes, it is also important to point out that variation in procedural techniques and pharmacological choices are the acceptable norm. The indications, contraindications, technique, and results of the above procedure should only be interpreted and judged by a Board-Certified Interventional Pain Specialist with extensive familiarity and expertise in the same exact procedure and technique.

## 2018-10-24 NOTE — Progress Notes (Signed)
Safety precautions to be maintained throughout the outpatient stay will include: orient to surroundings, keep bed in low position, maintain call bell within reach at all times, provide assistance with transfer out of bed and ambulation.  

## 2018-10-24 NOTE — Patient Instructions (Signed)
Pain Management Discharge Instructions  General Discharge Instructions :  If you need to reach your doctor call: Monday-Friday 8:00 am - 4:00 pm at [phone] or toll free [phone].  After clinic hours [phone] to have operator reach doctor.  Bring all of your medication bottles to all your appointments in the pain clinic.  To cancel or reschedule your appointment with Pain Management please remember to call 24 hours in advance to avoid a fee.  Refer to the educational materials which you have been given on: General Risks, I had my Procedure. Discharge Instructions, Post Sedation.  Post Procedure Instructions:  The drugs you were given will stay in your system until tomorrow, so for the next 24 hours you should not drive, make any legal decisions or drink any alcoholic beverages.  You may eat anything you prefer, but it is better to start with liquids then soups and crackers, and gradually work up to solid foods.  Please notify your doctor immediately if you have any unusual bleeding, trouble breathing or pain that is not related to your normal pain.  Depending on the type of procedure that was done, some parts of your body may feel week and/or numb.  This usually clears up by tonight or the next day.  Walk with the use of an assistive device or accompanied by an adult for the 24 hours.  You may use ice on the affected area for the first 24 hours.  Put ice in a Ziploc bag and cover with a towel and place against area 15 minutes on 15 minutes off.  You may switch to heat after 24 hours.Epidural Steroid Injection Patient Information  Description: The epidural space surrounds the nerves as they exit the spinal cord.  In some patients, the nerves can be compressed and inflamed by a bulging disc or a tight spinal canal (spinal stenosis).  By injecting steroids into the epidural space, we can bring irritated nerves into direct contact with a potentially helpful medication.  These  steroids act directly on the irritated nerves and can reduce swelling and inflammation which often leads to decreased pain.  Epidural steroids may be injected anywhere along the spine and from the neck to the low back depending upon the location of your pain.   After numbing the skin with local anesthetic (like Novocaine), a small needle is passed into the epidural space slowly.  You may experience a sensation of pressure while this is being done.  The entire block usually last less than 10 minutes.  Conditions which may be treated by epidural steroids:   Low back and leg pain  Neck and arm pain  Spinal stenosis  Post-laminectomy syndrome  Herpes zoster (shingles) pain  Pain from compression fractures  Preparation for the injection:  1. Do not eat any solid food or dairy products within 8 hours of your appointment.  2. You may drink clear liquids up to 3 hours before appointment.  Clear liquids include water, black coffee, juice or soda.  No milk or cream please. 3. You may take your regular medication, including pain medications, with a sip of water before your appointment  Diabetics should hold regular insulin (if taken separately) and take 1/2 normal NPH dos the morning of the procedure.  Carry some sugar containing items with you to your appointment. 4. A driver must accompany you and be prepared to drive you home after your procedure.  5. Bring all your current medications with your. 6. An IV may be inserted and   sedation may be given at the discretion of the physician.   7. A blood pressure cuff, EKG and other monitors will often be applied during the procedure.  Some patients may need to have extra oxygen administered for a short period. 8. You will be asked to provide medical information, including your allergies, prior to the procedure.  We must know immediately if you are taking blood thinners (like Coumadin/Warfarin)  Or if you are allergic to IV iodine contrast (dye). We must  know if you could possible be pregnant.  Possible side-effects:  Bleeding from needle site  Infection (rare, may require surgery)  Nerve injury (rare)  Numbness & tingling (temporary)  Difficulty urinating (rare, temporary)  Spinal headache ( a headache worse with upright posture)  Light -headedness (temporary)  Pain at injection site (several days)  Decreased blood pressure (temporary)  Weakness in arm/leg (temporary)  Pressure sensation in back/neck (temporary)  Call if you experience:  Fever/chills associated with headache or increased back/neck pain.  Headache worsened by an upright position.  New onset weakness or numbness of an extremity below the injection site  Hives or difficulty breathing (go to the emergency room)  Inflammation or drainage at the infection site  Severe back/neck pain  Any new symptoms which are concerning to you  Please note:  Although the local anesthetic injected can often make your back or neck feel good for several hours after the injection, the pain will likely return.  It takes 3-7 days for steroids to work in the epidural space.  You may not notice any pain relief for at least that one week.  If effective, we will often do a series of three injections spaced 3-6 weeks apart to maximally decrease your pain.  After the initial series, we generally will wait several months before considering a repeat injection of the same type.  If you have any questions, please call [phone] [facility] 

## 2018-10-25 ENCOUNTER — Telehealth: Payer: Self-pay | Admitting: *Deleted

## 2018-10-25 NOTE — Telephone Encounter (Signed)
Attempted to call for post procedure follow-up. Unable to leave a message. 

## 2018-11-17 ENCOUNTER — Encounter: Payer: Self-pay | Admitting: Student in an Organized Health Care Education/Training Program

## 2018-11-17 ENCOUNTER — Ambulatory Visit
Payer: Medicaid Other | Attending: Student in an Organized Health Care Education/Training Program | Admitting: Student in an Organized Health Care Education/Training Program

## 2018-11-17 ENCOUNTER — Other Ambulatory Visit: Payer: Self-pay

## 2018-11-17 VITALS — BP 113/59 | HR 76 | Temp 99.7°F | Ht 64.0 in | Wt 118.0 lb

## 2018-11-17 DIAGNOSIS — G894 Chronic pain syndrome: Secondary | ICD-10-CM | POA: Diagnosis present

## 2018-11-17 DIAGNOSIS — M961 Postlaminectomy syndrome, not elsewhere classified: Secondary | ICD-10-CM | POA: Diagnosis present

## 2018-11-17 DIAGNOSIS — M792 Neuralgia and neuritis, unspecified: Secondary | ICD-10-CM | POA: Diagnosis present

## 2018-11-17 DIAGNOSIS — M47816 Spondylosis without myelopathy or radiculopathy, lumbar region: Secondary | ICD-10-CM | POA: Diagnosis not present

## 2018-11-17 DIAGNOSIS — Z9889 Other specified postprocedural states: Secondary | ICD-10-CM | POA: Diagnosis present

## 2018-11-17 MED ORDER — HYDROCODONE-ACETAMINOPHEN 7.5-325 MG PO TABS: 1.0000 | ORAL_TABLET | Freq: Every day | ORAL | 0 refills | Status: DC | PRN

## 2018-11-17 NOTE — Progress Notes (Signed)
Patient's Name: Dana Goodwin  MRN: [id number]  Referring Provider: Abran Richard, MD  DOB: 1961/02/09  PCP: Abran Richard, MD  DOS: 11/17/2018  Note by: Gillis Santa, MD  Service setting: Ambulatory outpatient  Specialty: Interventional Pain Management  Location: [facility] (AMB) Pain Management Facility    Patient type: Established   Primary Reason(s) for Visit: Encounter for post-procedure evaluation of chronic illness with mild to moderate exacerbation CC: Leg Pain  HPI  Dana Goodwin is a 58 y.o. year old, female patient, who comes today for a post-procedure evaluation. She has Special screening for malignant neoplasms, colon; Lumbar disc herniation; History of lumbar surgery left L3-L4 laminotomy and microdiscectomy October 2018; Postherpetic neuralgia (shingles Oct 2019 Left L4/5 dermatome); Neuropathic pain; Post laminectomy syndrome; Chronic pain syndrome; and Lumbar facet arthropathy on their problem list. Her primarily concern today is the Leg Pain  Pain Assessment: Location: Left Leg Radiating: down left leg, at times to ankle Onset: More than a month ago Duration: Chronic pain Quality: Burning, Tingling, Shooting Severity: 6 /10 (subjective, self-reported pain score)  Note: Reported level is inconsistent with clinical observations.                         When using our objective Pain Scale, levels between 6 and 10/10 are said to belong in an emergency room, as it progressively worsens from a 6/10, described as severely limiting, requiring emergency care not usually available at an outpatient pain management facility. At a 6/10 level, communication becomes difficult and requires great effort. Assistance to reach the emergency department may be required. Facial flushing and profuse sweating along with potentially dangerous increases in heart rate and blood pressure will be evident. Effect on ADL: limits my daily activities Timing: Constant Modifying factors: walking, medication BP: (!)  113/59  HR: 76  Dana Goodwin comes in today for post-procedure evaluation.  Further details on both, my assessment(s), as well as the proposed treatment plan, please see below.  Post-Procedure Assessment  10/24/2018 Procedure: Left L3/4 ESI Pre-procedure pain score:  5/10 Post-procedure pain score: 5/10         Influential Factors: BMI: 20.25 kg/m Intra-procedural challenges: None observed.         Assessment challenges: None detected.              Reported side-effects: None.        Post-procedural adverse reactions or complications: None reported         Sedation: Please see nurses note. When no sedatives are used, the analgesic levels obtained are directly associated to the effectiveness of the local anesthetics. However, when sedation is provided, the level of analgesia obtained during the initial 1 hour following the intervention, is believed to be the result of a combination of factors. These factors may include, but are not limited to: 1. The effectiveness of the local anesthetics used. 2. The effects of the analgesic(s) and/or anxiolytic(s) used. 3. The degree of discomfort experienced by the patient at the time of the procedure. 4. The patients ability and reliability in recalling and recording the events. 5. The presence and influence of possible secondary gains and/or psychosocial factors. Reported result: Relief experienced during the 1st hour after the procedure: 100 % (Ultra-Short Term Relief)            Interpretative annotation: Clinically appropriate result. Analgesia during this period is likely to be Local Anesthetic and/or IV Sedative (Analgesic/Anxiolytic) related.  Effects of local anesthetic: The analgesic effects attained during this period are directly associated to the localized infiltration of local anesthetics and therefore cary significant diagnostic value as to the etiological location, or anatomical origin, of the pain. Expected duration of relief is  directly dependent on the pharmacodynamics of the local anesthetic used. Long-acting (4-6 hours) anesthetics used.  Reported result: Relief during the next 4 to 6 hour after the procedure: 100 % (Short-Term Relief)            Interpretative annotation: Clinically appropriate result. Analgesia during this period is likely to be Local Anesthetic-related.          Long-term benefit: Defined as the period of time past the expected duration of local anesthetics (1 hour for short-acting and 4-6 hours for long-acting). With the possible exception of prolonged sympathetic blockade from the local anesthetics, benefits during this period are typically attributed to, or associated with, other factors such as analgesic sensory neuropraxia, antiinflammatory effects, or beneficial biochemical changes provided by agents other than the local anesthetics.  Reported result: Extended relief following procedure: 100 %(last for 4 to 5 days) (Long-Term Relief)            Interpretative annotation: Clinically possible results. Good relief. No permanent benefit expected. Inflammation plays a part in the etiology to the pain.          Current benefits: Defined as reported results that persistent at this point in time.   Analgesia: 0-25 %            Function: Back to baseline ROM: Back to baseline Interpretative annotation: Recurrence of symptoms. No permanent benefit expected. Results would suggest persistent aggravating factors.          Interpretation: Results would suggest failure of therapy in achieving desired goal(s).                  Plan:  Please see "Plan of Care" for details.                Laboratory Chemistry  Inflammation Markers (CRP: Acute Phase) (ESR: Chronic Phase) No results found for: CRP, ESRSEDRATE, LATICACIDVEN                       Rheumatology Markers No results found for: RF, ANA, LABURIC, URICUR, LYMEIGGIGMAB, LYMEABIGMQN, HLAB27                      Renal Function Markers Lab Results   Component Value Date   BUN 13 10/20/2017   CREATININE 0.67 10/20/2017   GFRAA >60 10/20/2017   GFRNONAA >60 10/20/2017                             Hepatic Function Markers No results found for: AST, ALT, ALBUMIN, ALKPHOS, HCVAB, AMYLASE, LIPASE, AMMONIA                      Electrolytes Lab Results  Component Value Date   NA 139 10/20/2017   K 4.1 10/20/2017   CL 103 10/20/2017   CALCIUM 9.7 10/20/2017                         Coagulation Parameters Lab Results  Component Value Date   PLT 239 10/20/2017  Cardiovascular Markers Lab Results  Component Value Date   HGB 14.1 10/20/2017   HCT 42.6 10/20/2017                         CA Markers No results found for: CEA, CA125, LABCA2                      Endocrine Markers No results found for: TSH, FREET4, TESTOFREE, TESTOSTERONE, ESTRADIOL, ESTRADIOLPCT, ESTRADIOLFRE                      Note: Lab results reviewed.  Recent Diagnostic Imaging Results  DG C-Arm 1-60 Min-No Report Fluoroscopy was utilized by the requesting physician.  No radiographic  interpretation.   Complexity Note: Imaging results reviewed. Results shared with Ms. Kirkey, using Layman's terms.                         Meds   Current Outpatient Medications:  .  acetaminophen (TYLENOL) 500 MG tablet, Take 500 mg by mouth every 6 (six) hours as needed., Disp: , Rfl:  .  ibuprofen (ADVIL,MOTRIN) 600 MG tablet, Take 1 tablet (600 mg total) by mouth every 6 (six) hours as needed., Disp: 30 tablet, Rfl: 0 .  pregabalin (LYRICA) 50 MG capsule, 50 mg qhs x 2 weeks then 50 mg BID, Disp: 60 capsule, Rfl: 1 .  HYDROcodone-acetaminophen (NORCO) 7.5-325 MG tablet, Take 1 tablet by mouth daily as needed for up to 30 days for moderate pain., Disp: 30 tablet, Rfl: 0  ROS  Constitutional: Denies any fever or chills Gastrointestinal: No reported hemesis, hematochezia, vomiting, or acute GI distress Musculoskeletal: Denies any acute onset  joint swelling, redness, loss of ROM, or weakness Neurological: No reported episodes of acute onset apraxia, aphasia, dysarthria, agnosia, amnesia, paralysis, loss of coordination, or loss of consciousness  Allergies  Ms. Zanders is allergic to no known allergies.  [place]  Drug: Ms. Bedgood  reports no history of drug use. Alcohol:  reports no history of alcohol use. Tobacco:  reports that she has been smoking. She has a 18.50 pack-year smoking history. She has never used smokeless tobacco. Medical:  has a past medical history of Chronic back pain, History of colon polyps, Numbness, Sciatica, and Urinary frequency. Surgical: Ms. Vollmer  has a past surgical history that includes Colonoscopy (N/A, 06/19/2016); Back surgery; Fracture surgery (Left); and Lumbar laminectomy/decompression microdiscectomy (Left, 12/30/2016). Family: family history includes Diabetes in her brother; Heart attack in her brother and mother; Prostate cancer in her father.  Constitutional Exam  General appearance: Well nourished, well developed, and well hydrated. In no apparent acute distress Vitals:   11/17/18 0959  BP: (!) 113/59  Pulse: 76  Temp: 99.7 F (37.6 C)  SpO2: 99%  Weight: 118 lb (53.5 kg)  Height: 5' 4"  (1.626 m)   BMI Assessment: Estimated body mass index is 20.25 kg/m as calculated from the following:   Height as of this encounter: 5' 4"  (1.626 m).   Weight as of this encounter: 118 lb (53.5 kg).  BMI interpretation table: BMI level Category Range association with higher incidence of chronic pain  <18 kg/m2 Underweight   18.5-24.9 kg/m2 Ideal body weight   25-29.9 kg/m2 Overweight Increased incidence by 20%  30-34.9 kg/m2 Obese (Class I) Increased incidence by 68%  35-39.9 kg/m2 Severe obesity (Class II) Increased incidence by 136%  >40 kg/m2 Extreme obesity (Class III)  Increased incidence by 254%   Patient's current BMI Ideal Body weight  Body mass index is 20.25 kg/m. Ideal body weight: 54.7  kg (120 lb 9.5 oz)   BMI Readings from Last 4 Encounters:  11/17/18 20.25 kg/m  10/24/18 20.57 kg/m  10/13/18 20.90 kg/m  08/18/18 20.25 kg/m   Wt Readings from Last 4 Encounters:  11/17/18 118 lb (53.5 kg)  10/24/18 118 lb (53.5 kg)  10/13/18 118 lb (53.5 kg)  08/18/18 118 lb (53.5 kg)  Psych/Mental status: Alert, oriented x 3 (person, place, & time)       Eyes: PERLA Respiratory: No evidence of acute respiratory distress   Thoracic Spine Area Exam  Skin & Axial Inspection: No masses, redness, or swelling Alignment: Symmetrical Functional ROM: Unrestricted ROM Stability: No instability detected Muscle Tone/Strength: Functionally intact. No obvious neuro-muscular anomalies detected. Sensory (Neurological): Unimpaired Muscle strength & Tone: No palpable anomalies  Lumbar Spine Area Exam  Skin & Axial Inspection: Well healed scar from previous spine surgery detected Alignment: Symmetrical Functional ROM: Decreased ROM       Stability: No instability detected Muscle Tone/Strength: Functionally intact. No obvious neuro-muscular anomalies detected. Sensory (Neurological): Dermatomal pain pattern and articular Palpation: Tender       Provocative Tests: Hyperextension/rotation test: (+) bilaterally for facet joint pain. Lumbar quadrant test (Kemp's test): (+) bilaterally for facet joint pain. Lateral bending test: (+) due to pain. Patrick's Maneuver: deferred today                   FABER* test: deferred today                   S-I anterior distraction/compression test: deferred today         S-I lateral compression test: deferred today         S-I Thigh-thrust test: deferred today         S-I Gaenslen's test: deferred today         *(Flexion, ABduction and External Rotation)  Gait & Posture Assessment  Ambulation: Unassisted Gait: Relatively normal for age and body habitus Posture: WNL   Lower Extremity Exam    Side: Right lower extremity  Side: Left lower extremity   Stability: No instability observed          Stability: No instability observed          Skin & Extremity Inspection: Skin color, temperature, and hair growth are WNL. No peripheral edema or cyanosis. No masses, redness, swelling, asymmetry, or associated skin lesions. No contractures.  Skin & Extremity Inspection: Skin color, temperature, and hair growth are WNL. No peripheral edema or cyanosis. No masses, redness, swelling, asymmetry, or associated skin lesions. No contractures.  Functional ROM: Unrestricted ROM                  Functional ROM: Pain restricted ROM for hip and knee joints          Muscle Tone/Strength: Functionally intact. No obvious neuro-muscular anomalies detected.  Muscle Tone/Strength: Functionally intact. No obvious neuro-muscular anomalies detected.  Sensory (Neurological): Unimpaired        Sensory (Neurological): Neuropathic pain pattern left lateral leg, L5        DTR: Patellar: deferred today Achilles: deferred today Plantar: deferred today  DTR: Patellar: deferred today Achilles: deferred today Plantar: deferred today  Palpation: No palpable anomalies  Palpation: No palpable anomalies   Assessment   Status Diagnosis  Persistent Persistent Persistent 1. Lumbar facet arthropathy  2. Lumbar spondylosis   3. Post laminectomy syndrome   4. History of lumbar surgery left L3-L4 laminotomy and microdiscectomy October 2018   5. Neuropathic pain   6. Chronic pain syndrome      Updated Problems: Problem  Lumbar Facet Arthropathy   Verne Carrow has a history of greater than 3 months of moderate to severe pain which is resulted in functional impairment.  The patient has tried various conservative therapeutic options such as NSAIDs, Tylenol, muscle relaxants, physical therapy which was inadequately effective.  Patient's pain is predominantly axial with physical exam findings suggestive of facet arthropathy.  Lumbar facet medial branch nerve blocks were discussed  with the patient.  Risks and benefits were reviewed.  Patient would like to proceed with bilateral L3, L4, L5, S1 medial branch nerve block.  Can consider lumbar radiofrequency ablation thereafter.  Patient could also be a candidate for lumbar spinal cord stimulation.  Also provide prescription for hydrocodone as below.  Patient to sign opioid contract.  Urine drug screen within normal limits.  [place] PMP checked and appropriate.  Plan: -Sign opioid contract -Prescription for hydrocodone 7.5 mg daily PRN, quantity 30/month. -Plan for diagnostic lumbar facet medial branch nerve blocks without sedation at L3, L4, L5, S1 -Continue Lyrica as prescribed  Future considerations: Lumbar spinal cord stimulation   Plan of Care  Pharmacotherapy (Medications Ordered): Meds ordered this encounter  Medications  . HYDROcodone-acetaminophen (NORCO) 7.5-325 MG tablet    Sig: Take 1 tablet by mouth daily as needed for up to 30 days for moderate pain.    Dispense:  30 tablet    Refill:  0    Do not place this medication, or any other prescription from our practice, on "Automatic Refill". Patient may have prescription filled one day early if pharmacy is closed on scheduled refill date.   Lab-work, procedure(s), and/or referral(s): Orders Placed This Encounter  Procedures  . LUMBAR FACET(MEDIAL BRANCH NERVE BLOCK) MBNB   Provider-requested follow-up: Return in about 1 week (around 11/24/2018) for Procedure (also med refill).  Time Note: Greater than 50% of the 25 minute(s) of face-to-face time spent with Ms. Pinera, was spent in counseling/coordination of care regarding: Ms. Arave primary cause of pain, the treatment plan, treatment alternatives, the risks and possible complications of proposed treatment, medication side effects, going over the informed consent, the opioid analgesic risks and possible complications, the results, interpretation and significance of  her recent diagnostic interventional  treatment(s), the appropriate use of her medications, realistic expectations, the goals of pain management (increased in functionality), the medication agreement and the patient's responsibilities when it comes to controlled substances.  Future Appointments  Date Time Provider [place]  11/28/2018 10:00 AM Gillis Santa, MD [facility] None    Primary Care Physician: Abran Richard, MD Location: [facility] Outpatient Pain Management Facility Note by: Gillis Santa, M.D Date: 11/17/2018; Time: 2:19 PM  Patient Instructions   Sign opioid contract Prescription for 1 month Hydrocodone with acetaminophen to last until 12/17/2018 has been escribed to your pharmacy.   GENERAL RISKS AND COMPLICATIONS  What are the risk, side effects and possible complications? Generally speaking, most procedures are safe.  However, with any procedure there are risks, side effects, and the possibility of complications.  The risks and complications are dependent upon the sites that are lesioned, or the type of nerve block to be performed.  The closer the procedure is to the spine, the more serious the risks are.  Great care is taken when  placing the radio frequency needles, block needles or lesioning probes, but sometimes complications can occur. 1. Infection: Any time there is an injection through the skin, there is a risk of infection.  This is why sterile conditions are used for these blocks.  There are four possible types of infection. 1. Localized skin infection. 2. Central Nervous System Infection-This can be in the form of Meningitis, which can be deadly. 3. Epidural Infections-This can be in the form of an epidural abscess, which can cause pressure inside of the spine, causing compression of the spinal cord with subsequent paralysis. This would require an emergency surgery to decompress, and there are no guarantees that the patient would recover from the paralysis. 4. Discitis-This is an infection of the  intervertebral discs.  It occurs in about 1% of discography procedures.  It is difficult to treat and it may lead to surgery.        2. Pain: the needles have to go through skin and soft tissues, will cause soreness.       3. Damage to internal structures:  The nerves to be lesioned may be near blood vessels or    other nerves which can be potentially damaged.       4. Bleeding: Bleeding is more common if the patient is taking blood thinners such as  aspirin, Coumadin, Ticiid, Plavix, etc., or if he/she have some genetic predisposition  such as hemophilia. Bleeding into the spinal canal can cause compression of the spinal  cord with subsequent paralysis.  This would require an emergency surgery to  decompress and there are no guarantees that the patient would recover from the  paralysis.       5. Pneumothorax:  Puncturing of a lung is a possibility, every time a needle is introduced in  the area of the chest or upper back.  Pneumothorax refers to free air around the  collapsed lung(s), inside of the thoracic cavity (chest cavity).  Another two possible  complications related to a similar event would include: Hemothorax and Chylothorax.   These are variations of the Pneumothorax, where instead of air around the collapsed  lung(s), you may have blood or chyle, respectively.       6. Spinal headaches: They may occur with any procedures in the area of the spine.       7. Persistent CSF (Cerebro-Spinal Fluid) leakage: This is a rare problem, but may occur  with prolonged intrathecal or epidural catheters either due to the formation of a fistulous  track or a dural tear.       8. Nerve damage: By working so close to the spinal cord, there is always a possibility of  nerve damage, which could be as serious as a permanent spinal cord injury with  paralysis.       9. Death:  Although rare, severe deadly allergic reactions known as "Anaphylactic  reaction" can occur to any of the medications used.       10. Worsening of the symptoms:  We can always make thing worse.  What are the chances of something like this happening? Chances of any of this occuring are extremely low.  By statistics, you have more of a chance of getting killed in a motor vehicle accident: while driving to the hospital than any of the above occurring .  Nevertheless, you should be aware that they are possibilities.  In general, it is similar to taking a shower.  Everybody knows that you can slip, hit your  head and get killed.  Does that mean that you should not shower again?  Nevertheless always keep in mind that statistics do not mean anything if you happen to be on the wrong side of them.  Even if a procedure has a 1 (one) in a 1,000,000 (million) chance of going wrong, it you happen to be that one..Also, keep in mind that by statistics, you have more of a chance of having something go wrong when taking medications.  Who should not have this procedure? If you are on a blood thinning medication (e.g. Coumadin, Plavix, see list of "Blood Thinners"), or if you have an active infection going on, you should not have the procedure.  If you are taking any blood thinners, please inform your physician.  How should I prepare for this procedure?  Do not eat or drink anything at least six hours prior to the procedure.  Bring a driver with you .  It cannot be a taxi.  Come accompanied by an adult that can drive you back, and that is strong enough to help you if your legs get weak or numb from the local anesthetic.  Take all of your medicines the morning of the procedure with just enough water to swallow them.  If you have diabetes, make sure that you are scheduled to have your procedure done first thing in the morning, whenever possible.  If you have diabetes, take only half of your insulin dose and notify our nurse that you have done so as soon as you arrive at the clinic.  If you are diabetic, but only take blood sugar pills (oral  hypoglycemic), then do not take them on the morning of your procedure.  You may take them after you have had the procedure.  Do not take aspirin or any aspirin-containing medications, at least eleven (11) days prior to the procedure.  They may prolong bleeding.  Wear loose fitting clothing that may be easy to take off and that you would not mind if it got stained with Betadine or blood.  Do not wear any jewelry or perfume  Remove any nail coloring.  It will interfere with some of our monitoring equipment.  NOTE: Remember that this is not meant to be interpreted as a complete list of all possible complications.  Unforeseen problems may occur.  BLOOD THINNERS The following drugs contain aspirin or other products, which can cause increased bleeding during surgery and should not be taken for 2 weeks prior to and 1 week after surgery.  If you should need take something for relief of minor pain, you may take acetaminophen which is found in Tylenol,m Datril, Anacin-3 and Panadol. It is not blood thinner. The products listed below are.  Do not take any of the products listed below in addition to any listed on your instruction sheet.  A.P.C or A.P.C with Codeine Codeine Phosphate Capsules #3 Ibuprofen Ridaura  ABC compound Congesprin Imuran rimadil  Advil Cope Indocin Robaxisal  Alka-Seltzer Effervescent Pain Reliever and Antacid Coricidin or Coricidin-D  Indomethacin Rufen  Alka-Seltzer plus Cold Medicine Cosprin Ketoprofen S-A-C Tablets  Anacin Analgesic Tablets or Capsules Coumadin Korlgesic Salflex  Anacin Extra Strength Analgesic tablets or capsules CP-2 Tablets Lanoril Salicylate  Anaprox Cuprimine Capsules Levenox Salocol  Anexsia-D Dalteparin Magan Salsalate  Anodynos Darvon compound Magnesium Salicylate Sine-off  Ansaid Dasin Capsules Magsal Sodium Salicylate  Anturane Depen Capsules Marnal Soma  APF Arthritis pain formula Dewitt's Pills Measurin Stanback  Argesic Dia-Gesic Meclofenamic  Sulfinpyrazone  Arthritis  Bayer Timed Release Aspirin Diclofenac Meclomen Sulindac  Arthritis pain formula Anacin Dicumarol Medipren Supac  Analgesic (Safety coated) Arthralgen Diffunasal Mefanamic Suprofen  Arthritis Strength Bufferin Dihydrocodeine Mepro Compound Suprol  Arthropan liquid Dopirydamole Methcarbomol with Aspirin Synalgos  ASA tablets/Enseals Disalcid Micrainin Tagament  Ascriptin Doan's Midol Talwin  Ascriptin A/D Dolene Mobidin Tanderil  Ascriptin Extra Strength Dolobid Moblgesic Ticlid  Ascriptin with Codeine Doloprin or Doloprin with Codeine Momentum Tolectin  Asperbuf Duoprin Mono-gesic Trendar  Aspergum Duradyne Motrin or Motrin IB Triminicin  Aspirin plain, buffered or enteric coated Durasal Myochrisine Trigesic  Aspirin Suppositories Easprin Nalfon Trillsate  Aspirin with Codeine Ecotrin Regular or Extra Strength Naprosyn Uracel  Atromid-S Efficin Naproxen Ursinus  Auranofin Capsules Elmiron Neocylate Vanquish  Axotal Emagrin Norgesic Verin  Azathioprine Empirin or Empirin with Codeine Normiflo Vitamin E  Azolid Emprazil Nuprin Voltaren  Bayer Aspirin plain, buffered or children's or timed BC Tablets or powders Encaprin Orgaran Warfarin Sodium  Buff-a-Comp Enoxaparin Orudis Zorpin  Buff-a-Comp with Codeine Equegesic Os-Cal-Gesic   Buffaprin Excedrin plain, buffered or Extra Strength Oxalid   Bufferin Arthritis Strength Feldene Oxphenbutazone   Bufferin plain or Extra Strength Feldene Capsules Oxycodone with Aspirin   Bufferin with Codeine Fenoprofen Fenoprofen Pabalate or Pabalate-SF   Buffets II Flogesic Panagesic   Buffinol plain or Extra Strength Florinal or Florinal with Codeine Panwarfarin   Buf-Tabs Flurbiprofen Penicillamine   Butalbital Compound Four-way cold tablets Penicillin   Butazolidin Fragmin Pepto-Bismol   Carbenicillin Geminisyn Percodan   Carna Arthritis Reliever Geopen Persantine   Carprofen Gold's salt Persistin   Chloramphenicol Goody's  Phenylbutazone   Chloromycetin Haltrain Piroxlcam   Clmetidine heparin Plaquenil   Cllnoril Hyco-pap Ponstel   Clofibrate Hydroxy chloroquine Propoxyphen         Before stopping any of these medications, be sure to consult the physician who ordered them.  Some, such as Coumadin (Warfarin) are ordered to prevent or treat serious conditions such as "deep thrombosis", "pumonary embolisms", and other heart problems.  The amount of time that you may need off of the medication may also vary with the medication and the reason for which you were taking it.  If you are taking any of these medications, please make sure you notify your pain physician before you undergo any procedures.         Facet Blocks Patient Information  Description: The facets are joints in the spine between the vertebrae.  Like any joints in the body, facets can become irritated and painful.  Arthritis can also effect the facets.  By injecting steroids and local anesthetic in and around these joints, we can temporarily block the nerve supply to them.  Steroids act directly on irritated nerves and tissues to reduce selling and inflammation which often leads to decreased pain.  Facet blocks may be done anywhere along the spine from the neck to the low back depending upon the location of your pain.   After numbing the skin with local anesthetic (like Novocaine), a small needle is passed onto the facet joints under x-ray guidance.  You may experience a sensation of pressure while this is being done.  The entire block usually lasts about 15-25 minutes.   Conditions which may be treated by facet blocks:   Low back/buttock pain  Neck/shoulder pain  Certain types of headaches  Preparation for the injection:  1. Do not eat any solid food or dairy products within 8 hours of your appointment. 2. You may drink clear liquid up to 3 hours  before appointment.  Clear liquids include water, black coffee, juice or soda.  No milk or cream  please. 3. You may take your regular medication, including pain medications, with a sip of water before your appointment.  Diabetics should hold regular insulin (if taken separately) and take 1/2 normal NPH dose the morning of the procedure.  Carry some sugar containing items with you to your appointment. 4. A driver must accompany you and be prepared to drive you home after your procedure. 5. Bring all your current medications with you. 6. An IV may be inserted and sedation may be given at the discretion of the physician. 7. A blood pressure cuff, EKG and other monitors will often be applied during the procedure.  Some patients may need to have extra oxygen administered for a short period. 8. You will be asked to provide medical information, including your allergies and medications, prior to the procedure.  We must know immediately if you are taking blood thinners (like Coumadin/Warfarin) or if you are allergic to IV iodine contrast (dye).  We must know if you could possible be pregnant.  Possible side-effects:   Bleeding from needle site  Infection (rare, may require surgery)  Nerve injury (rare)  Numbness & tingling (temporary)  Difficulty urinating (rare, temporary)  Spinal headache (a headache worse with upright posture)  Light-headedness (temporary)  Pain at injection site (serveral days)  Decreased blood pressure (rare, temporary)  Weakness in arm/leg (temporary)  Pressure sensation in back/neck (temporary)   Call if you experience:   Fever/chills associated with headache or increased back/neck pain  Headache worsened by an upright position  New onset, weakness or numbness of an extremity below the injection site  Hives or difficulty breathing (go to the emergency room)  Inflammation or drainage at the injection site(s)  Severe back/neck pain greater than usual  New symptoms which are concerning to you  Please note:  Although the local anesthetic injected  can often make your back or neck feel good for several hours after the injection, the pain will likely return. It takes 3-7 days for steroids to work.  You may not notice any pain relief for at least one week.  If effective, we will often do a series of 2-3 injections spaced 3-6 weeks apart to maximally decrease your pain.  After the initial series, you may be a candidate for a more permanent nerve block of the facets.  If you have any questions, please call #[phone]) [facility]

## 2018-11-17 NOTE — Patient Instructions (Addendum)
Sign opioid contract Prescription for 1 month Hydrocodone with acetaminophen to last until 12/17/2018 has been escribed to your pharmacy.   GENERAL RISKS AND COMPLICATIONS  What are the risk, side effects and possible complications? Generally speaking, most procedures are safe.  However, with any procedure there are risks, side effects, and the possibility of complications.  The risks and complications are dependent upon the sites that are lesioned, or the type of nerve block to be performed.  The closer the procedure is to the spine, the more serious the risks are.  Great care is taken when placing the radio frequency needles, block needles or lesioning probes, but sometimes complications can occur. 1. Infection: Any time there is an injection through the skin, there is a risk of infection.  This is why sterile conditions are used for these blocks.  There are four possible types of infection. 1. Localized skin infection. 2. Central Nervous System Infection-This can be in the form of Meningitis, which can be deadly. 3. Epidural Infections-This can be in the form of an epidural abscess, which can cause pressure inside of the spine, causing compression of the spinal cord with subsequent paralysis. This would require an emergency surgery to decompress, and there are no guarantees that the patient would recover from the paralysis. 4. Discitis-This is an infection of the intervertebral discs.  It occurs in about 1% of discography procedures.  It is difficult to treat and it may lead to surgery.        2. Pain: the needles have to go through skin and soft tissues, will cause soreness.       3. Damage to internal structures:  The nerves to be lesioned may be near blood vessels or    other nerves which can be potentially damaged.       4. Bleeding: Bleeding is more common if the patient is taking blood thinners such as  aspirin, Coumadin, Ticiid, Plavix, etc., or if he/she have some genetic predisposition   such as hemophilia. Bleeding into the spinal canal can cause compression of the spinal  cord with subsequent paralysis.  This would require an emergency surgery to  decompress and there are no guarantees that the patient would recover from the  paralysis.       5. Pneumothorax:  Puncturing of a lung is a possibility, every time a needle is introduced in  the area of the chest or upper back.  Pneumothorax refers to free air around the  collapsed lung(s), inside of the thoracic cavity (chest cavity).  Another two possible  complications related to a similar event would include: Hemothorax and Chylothorax.   These are variations of the Pneumothorax, where instead of air around the collapsed  lung(s), you may have blood or chyle, respectively.       6. Spinal headaches: They may occur with any procedures in the area of the spine.       7. Persistent CSF (Cerebro-Spinal Fluid) leakage: This is a rare problem, but may occur  with prolonged intrathecal or epidural catheters either due to the formation of a fistulous  track or a dural tear.       8. Nerve damage: By working so close to the spinal cord, there is always a possibility of  nerve damage, which could be as serious as a permanent spinal cord injury with  paralysis.       9. Death:  Although rare, severe deadly allergic reactions known as "Anaphylactic  reaction" can occur to  any of the medications used.      10. Worsening of the symptoms:  We can always make thing worse.  What are the chances of something like this happening? Chances of any of this occuring are extremely low.  By statistics, you have more of a chance of getting killed in a motor vehicle accident: while driving to the hospital than any of the above occurring .  Nevertheless, you should be aware that they are possibilities.  In general, it is similar to taking a shower.  Everybody knows that you can slip, hit your head and get killed.  Does that mean that you should not shower again?   Nevertheless always keep in mind that statistics do not mean anything if you happen to be on the wrong side of them.  Even if a procedure has a 1 (one) in a 1,000,000 (million) chance of going wrong, it you happen to be that one..Also, keep in mind that by statistics, you have more of a chance of having something go wrong when taking medications.  Who should not have this procedure? If you are on a blood thinning medication (e.g. Coumadin, Plavix, see list of "Blood Thinners"), or if you have an active infection going on, you should not have the procedure.  If you are taking any blood thinners, please inform your physician.  How should I prepare for this procedure?  Do not eat or drink anything at least six hours prior to the procedure.  Bring a driver with you .  It cannot be a taxi.  Come accompanied by an adult that can drive you back, and that is strong enough to help you if your legs get weak or numb from the local anesthetic.  Take all of your medicines the morning of the procedure with just enough water to swallow them.  If you have diabetes, make sure that you are scheduled to have your procedure done first thing in the morning, whenever possible.  If you have diabetes, take only half of your insulin dose and notify our nurse that you have done so as soon as you arrive at the clinic.  If you are diabetic, but only take blood sugar pills (oral hypoglycemic), then do not take them on the morning of your procedure.  You may take them after you have had the procedure.  Do not take aspirin or any aspirin-containing medications, at least eleven (11) days prior to the procedure.  They may prolong bleeding.  Wear loose fitting clothing that may be easy to take off and that you would not mind if it got stained with Betadine or blood.  Do not wear any jewelry or perfume  Remove any nail coloring.  It will interfere with some of our monitoring equipment.  NOTE: Remember that this is not  meant to be interpreted as a complete list of all possible complications.  Unforeseen problems may occur.  BLOOD THINNERS The following drugs contain aspirin or other products, which can cause increased bleeding during surgery and should not be taken for 2 weeks prior to and 1 week after surgery.  If you should need take something for relief of minor pain, you may take acetaminophen which is found in Tylenol,m Datril, Anacin-3 and Panadol. It is not blood thinner. The products listed below are.  Do not take any of the products listed below in addition to any listed on your instruction sheet.  A.P.C or A.P.C with Codeine Codeine Phosphate Capsules #3 Ibuprofen Ridaura  ABC  compound Congesprin Imuran rimadil  Advil Cope Indocin Robaxisal  Alka-Seltzer Effervescent Pain Reliever and Antacid Coricidin or Coricidin-D  Indomethacin Rufen  Alka-Seltzer plus Cold Medicine Cosprin Ketoprofen S-A-C Tablets  Anacin Analgesic Tablets or Capsules Coumadin Korlgesic Salflex  Anacin Extra Strength Analgesic tablets or capsules CP-2 Tablets Lanoril Salicylate  Anaprox Cuprimine Capsules Levenox Salocol  Anexsia-D Dalteparin Magan Salsalate  Anodynos Darvon compound Magnesium Salicylate Sine-off  Ansaid Dasin Capsules Magsal Sodium Salicylate  Anturane Depen Capsules Marnal Soma  APF Arthritis pain formula Dewitt's Pills Measurin Stanback  Argesic Dia-Gesic Meclofenamic Sulfinpyrazone  Arthritis Bayer Timed Release Aspirin Diclofenac Meclomen Sulindac  Arthritis pain formula Anacin Dicumarol Medipren Supac  Analgesic (Safety coated) Arthralgen Diffunasal Mefanamic Suprofen  Arthritis Strength Bufferin Dihydrocodeine Mepro Compound Suprol  Arthropan liquid Dopirydamole Methcarbomol with Aspirin Synalgos  ASA tablets/Enseals Disalcid Micrainin Tagament  Ascriptin Doan's Midol Talwin  Ascriptin A/D Dolene Mobidin Tanderil  Ascriptin Extra Strength Dolobid Moblgesic Ticlid  Ascriptin with Codeine Doloprin or  Doloprin with Codeine Momentum Tolectin  Asperbuf Duoprin Mono-gesic Trendar  Aspergum Duradyne Motrin or Motrin IB Triminicin  Aspirin plain, buffered or enteric coated Durasal Myochrisine Trigesic  Aspirin Suppositories Easprin Nalfon Trillsate  Aspirin with Codeine Ecotrin Regular or Extra Strength Naprosyn Uracel  Atromid-S Efficin Naproxen Ursinus  Auranofin Capsules Elmiron Neocylate Vanquish  Axotal Emagrin Norgesic Verin  Azathioprine Empirin or Empirin with Codeine Normiflo Vitamin E  Azolid Emprazil Nuprin Voltaren  Bayer Aspirin plain, buffered or children's or timed BC Tablets or powders Encaprin Orgaran Warfarin Sodium  Buff-a-Comp Enoxaparin Orudis Zorpin  Buff-a-Comp with Codeine Equegesic Os-Cal-Gesic   Buffaprin Excedrin plain, buffered or Extra Strength Oxalid   Bufferin Arthritis Strength Feldene Oxphenbutazone   Bufferin plain or Extra Strength Feldene Capsules Oxycodone with Aspirin   Bufferin with Codeine Fenoprofen Fenoprofen Pabalate or Pabalate-SF   Buffets II Flogesic Panagesic   Buffinol plain or Extra Strength Florinal or Florinal with Codeine Panwarfarin   Buf-Tabs Flurbiprofen Penicillamine   Butalbital Compound Four-way cold tablets Penicillin   Butazolidin Fragmin Pepto-Bismol   Carbenicillin Geminisyn Percodan   Carna Arthritis Reliever Geopen Persantine   Carprofen Gold's salt Persistin   Chloramphenicol Goody's Phenylbutazone   Chloromycetin Haltrain Piroxlcam   Clmetidine heparin Plaquenil   Cllnoril Hyco-pap Ponstel   Clofibrate Hydroxy chloroquine Propoxyphen         Before stopping any of these medications, be sure to consult the physician who ordered them.  Some, such as Coumadin (Warfarin) are ordered to prevent or treat serious conditions such as "deep thrombosis", "pumonary embolisms", and other heart problems.  The amount of time that you may need off of the medication may also vary with the medication and the reason for which you were  taking it.  If you are taking any of these medications, please make sure you notify your pain physician before you undergo any procedures.         Facet Blocks Patient Information  Description: The facets are joints in the spine between the vertebrae.  Like any joints in the body, facets can become irritated and painful.  Arthritis can also effect the facets.  By injecting steroids and local anesthetic in and around these joints, we can temporarily block the nerve supply to them.  Steroids act directly on irritated nerves and tissues to reduce selling and inflammation which often leads to decreased pain.  Facet blocks may be done anywhere along the spine from the neck to the low back depending upon the location of your pain.  After numbing the skin with local anesthetic (like Novocaine), a small needle is passed onto the facet joints under x-ray guidance.  You may experience a sensation of pressure while this is being done.  The entire block usually lasts about 15-25 minutes.   Conditions which may be treated by facet blocks:   Low back/buttock pain  Neck/shoulder pain  Certain types of headaches  Preparation for the injection:  1. Do not eat any solid food or dairy products within 8 hours of your appointment. 2. You may drink clear liquid up to 3 hours before appointment.  Clear liquids include water, black coffee, juice or soda.  No milk or cream please. 3. You may take your regular medication, including pain medications, with a sip of water before your appointment.  Diabetics should hold regular insulin (if taken separately) and take 1/2 normal NPH dose the morning of the procedure.  Carry some sugar containing items with you to your appointment. 4. A driver must accompany you and be prepared to drive you home after your procedure. 5. Bring all your current medications with you. 6. An IV may be inserted and sedation may be given at the discretion of the physician. 7. A blood  pressure cuff, EKG and other monitors will often be applied during the procedure.  Some patients may need to have extra oxygen administered for a short period. 8. You will be asked to provide medical information, including your allergies and medications, prior to the procedure.  We must know immediately if you are taking blood thinners (like Coumadin/Warfarin) or if you are allergic to IV iodine contrast (dye).  We must know if you could possible be pregnant.  Possible side-effects:   Bleeding from needle site  Infection (rare, may require surgery)  Nerve injury (rare)  Numbness & tingling (temporary)  Difficulty urinating (rare, temporary)  Spinal headache (a headache worse with upright posture)  Light-headedness (temporary)  Pain at injection site (serveral days)  Decreased blood pressure (rare, temporary)  Weakness in arm/leg (temporary)  Pressure sensation in back/neck (temporary)   Call if you experience:   Fever/chills associated with headache or increased back/neck pain  Headache worsened by an upright position  New onset, weakness or numbness of an extremity below the injection site  Hives or difficulty breathing (go to the emergency room)  Inflammation or drainage at the injection site(s)  Severe back/neck pain greater than usual  New symptoms which are concerning to you  Please note:  Although the local anesthetic injected can often make your back or neck feel good for several hours after the injection, the pain will likely return. It takes 3-7 days for steroids to work.  You may not notice any pain relief for at least one week.  If effective, we will often do a series of 2-3 injections spaced 3-6 weeks apart to maximally decrease your pain.  After the initial series, you may be a candidate for a more permanent nerve block of the facets.  If you have any questions, please call #[phone]) [facility]

## 2018-11-17 NOTE — Progress Notes (Signed)
Nursing Pain Medication Assessment:  Safety precautions to be maintained throughout the outpatient stay will include: orient to surroundings, keep bed in low position, maintain call bell within reach at all times, provide assistance with transfer out of bed and ambulation.  Medication Inspection Compliance: Dana Goodwin did not comply with our request to bring her pills to be counted. She was reminded that bringing the medication bottles, even when empty, is a requirement.  Medication: None brought in. Pill/Patch Count: None available to be counted. Bottle Appearance: No container available. Did not bring bottle(s) to appointment. Filled Date: N/A Last Medication intake:  Ran out of medicine more than 48 hours ago

## 2018-11-28 ENCOUNTER — Ambulatory Visit
Admission: RE | Admit: 2018-11-28 | Discharge: 2018-11-28 | Disposition: A | Discharge status: Home / Self Care | Payer: Medicaid Other | Source: Ambulatory Visit | Attending: Student in an Organized Health Care Education/Training Program | Admitting: Student in an Organized Health Care Education/Training Program

## 2018-11-28 ENCOUNTER — Other Ambulatory Visit: Payer: Self-pay

## 2018-11-28 ENCOUNTER — Encounter: Payer: Self-pay | Admitting: Student in an Organized Health Care Education/Training Program

## 2018-11-28 ENCOUNTER — Ambulatory Visit: Payer: Medicaid Other | Admitting: Student in an Organized Health Care Education/Training Program

## 2018-11-28 VITALS — BP 121/65 | HR 52 | Temp 98.4°F | Resp 18 | Ht 64.0 in | Wt 118.0 lb

## 2018-11-28 DIAGNOSIS — M47816 Spondylosis without myelopathy or radiculopathy, lumbar region: Secondary | ICD-10-CM

## 2018-11-28 MED ORDER — DEXAMETHASONE SODIUM PHOSPHATE 10 MG/ML IJ SOLN
10.0000 mg | Freq: Once | INTRAMUSCULAR | Status: AC
  Administered 2018-11-28: 10 mg
  Filled 2018-11-28: qty 1

## 2018-11-28 MED ORDER — ROPIVACAINE HCL 2 MG/ML IJ SOLN
10.0000 mL | Freq: Once | INTRAMUSCULAR | Status: AC
  Administered 2018-11-28: 10 mL
  Filled 2018-11-28: qty 10

## 2018-11-28 MED ORDER — LIDOCAINE HCL 2 % IJ SOLN
20.0000 mL | Freq: Once | INTRAMUSCULAR | Status: AC
  Administered 2018-11-28: 400 mg
  Filled 2018-11-28: qty 40

## 2018-11-28 NOTE — Progress Notes (Signed)
Safety precautions to be maintained throughout the outpatient stay will include: orient to surroundings, keep bed in low position, maintain call bell within reach at all times, provide assistance with transfer out of bed and ambulation.  

## 2018-11-28 NOTE — Patient Instructions (Signed)

## 2018-11-28 NOTE — Progress Notes (Signed)
After procedure pt was sweating,Pastey in color and dizzy.Dr Holley Raring notified and to room to assess.Heart decreased in 40's., cool washcloth applied to head turned on back and elevated feet. Iv was started via 22g infusing LR Bolus. Patient transferred to bed states she is feeling better.Pt will go to recovery

## 2018-11-28 NOTE — Progress Notes (Signed)
Patient's Name: Dana Goodwin  MRN: [id number]  Referring Provider: Abran Richard, MD  DOB: 1961-06-27  PCP: Abran Richard, MD  DOS: 11/28/2018  Note by: Gillis Santa, MD  Service setting: Ambulatory outpatient  Specialty: Interventional Pain Management  Patient type: Established  Location: [facility] (AMB) Pain Management Facility  Visit type: Interventional Procedure   Primary Reason for Visit: Interventional Pain Management Treatment. CC: Back Pain and Procedure  Procedure:          Anesthesia, Analgesia, Anxiolysis:  Type: Lumbar Facet, Medial Branch Block(s)          Primary Purpose: Diagnostic Region: Posterolateral Lumbosacral Spine Level:L3, L4, L5, & S1 Medial Branch Level(s). Injecting these levels blocks the L3-4, L4-5, and L5-S1 lumbar facet joints. Laterality: Bilateral   Local Anesthetic: Lidocaine 1-2%  Position: Prone   Indications: 1. Lumbar facet arthropathy   2. Lumbar spondylosis    Pain Score: Pre-procedure: 4 /10 Post-procedure: 0-No pain/10  Pre-op Assessment:  Dana Goodwin is a 58 y.o. (year old), female patient, seen today for interventional treatment. She  has a past surgical history that includes Colonoscopy (N/A, 06/19/2016); Back surgery; Fracture surgery (Left); and Lumbar laminectomy/decompression microdiscectomy (Left, 12/30/2016). Dana Goodwin has a current medication list which includes the following prescription(s): acetaminophen, hydrocodone-acetaminophen, ibuprofen, and pregabalin. Her primarily concern today is the Back Pain and Procedure  Initial Vital Signs:  Pulse/HCG Rate: 66ECG Heart Rate: 67 Temp: 98.4 F (36.9 C) Resp: 16 BP: (!) 114/50 SpO2: 100 %  BMI: Estimated body mass index is 20.25 kg/m as calculated from the following:   Height as of this encounter: 5\' 4"  (1.626 m).   Weight as of this encounter: 118 lb (53.5 kg).  Risk Assessment: Allergies: Reviewed. She is allergic to no known allergies.  Allergy Precautions: None  required Coagulopathies: Reviewed. None identified.  Blood-thinner therapy: None at this time Active Infection(s): Reviewed. None identified. Dana Goodwin is afebrile  Site Confirmation: Dana Goodwin was asked to confirm the procedure and laterality before marking the site Procedure checklist: Completed Consent: Before the procedure and under the influence of no sedative(s), amnesic(s), or anxiolytics, the patient was informed of the treatment options, risks and possible complications. To fulfill our ethical and legal obligations, as recommended by the American Medical Association's Code of Ethics, I have informed the patient of my clinical impression; the nature and purpose of the treatment or procedure; the risks, benefits, and possible complications of the intervention; the alternatives, including doing nothing; the risk(s) and benefit(s) of the alternative treatment(s) or procedure(s); and the risk(s) and benefit(s) of doing nothing. The patient was provided information about the general risks and possible complications associated with the procedure. These may include, but are not limited to: failure to achieve desired goals, infection, bleeding, organ or nerve damage, allergic reactions, paralysis, and death. In addition, the patient was informed of those risks and complications associated to Spine-related procedures, such as failure to decrease pain; infection (i.e.: Meningitis, epidural or intraspinal abscess); bleeding (i.e.: epidural hematoma, subarachnoid hemorrhage, or any other type of intraspinal or peri-dural bleeding); organ or nerve damage (i.e.: Any type of peripheral nerve, nerve root, or spinal cord injury) with subsequent damage to sensory, motor, and/or autonomic systems, resulting in permanent pain, numbness, and/or weakness of one or several areas of the body; allergic reactions; (i.e.: anaphylactic reaction); and/or death. Furthermore, the patient was informed of those risks and  complications associated with the medications. These include, but are not limited to: allergic reactions (i.e.: anaphylactic or  anaphylactoid reaction(s)); adrenal axis suppression; blood sugar elevation that in diabetics may result in ketoacidosis or comma; water retention that in patients with history of congestive heart failure may result in shortness of breath, pulmonary edema, and decompensation with resultant heart failure; weight gain; swelling or edema; medication-induced neural toxicity; particulate matter embolism and blood vessel occlusion with resultant organ, and/or nervous system infarction; and/or aseptic necrosis of one or more joints. Finally, the patient was informed that Medicine is not an exact science; therefore, there is also the possibility of unforeseen or unpredictable risks and/or possible complications that may result in a catastrophic outcome. The patient indicated having understood very clearly. We have given the patient no guarantees and we have made no promises. Enough time was given to the patient to ask questions, all of which were answered to the patient's satisfaction. Dana Goodwin has indicated that she wanted to continue with the procedure. Attestation: I, the ordering provider, attest that I have discussed with the patient the benefits, risks, side-effects, alternatives, likelihood of achieving goals, and potential problems during recovery for the procedure that I have provided informed consent. Date  Time: 11/28/2018  9:38 AM  Pre-Procedure Preparation:  Monitoring: As per clinic protocol. Respiration, ETCO2, SpO2, BP, heart rate and rhythm monitor placed and checked for adequate function Safety Precautions: Patient was assessed for positional comfort and pressure points before starting the procedure. Time-out: I initiated and conducted the "Time-out" before starting the procedure, as per protocol. The patient was asked to participate by confirming the accuracy of the  "Time Out" information. Verification of the correct person, site, and procedure were performed and confirmed by me, the nursing staff, and the patient. "Time-out" conducted as per Joint Commission's Universal Protocol (UP.01.01.01). Time: 1050  Description of Procedure:          Laterality: Bilateral. The procedure was performed in identical fashion on both sides. Levels:  L3, L4, L5, & S1 Medial Branch Level(s) Area Prepped: Posterior Lumbosacral Region Prepping solution: ChloraPrep (2% chlorhexidine gluconate and 70% isopropyl alcohol) Safety Precautions: Aspiration looking for blood return was conducted prior to all injections. At no point did we inject any substances, as a needle was being advanced. Before injecting, the patient was told to immediately notify me if she was experiencing any new onset of "ringing in the ears, or metallic taste in the mouth". No attempts were made at seeking any paresthesias. Safe injection practices and needle disposal techniques used. Medications properly checked for expiration dates. SDV (single dose vial) medications used. After the completion of the procedure, all disposable equipment used was discarded in the proper designated medical waste containers. Local Anesthesia: Protocol guidelines were followed. The patient was positioned over the fluoroscopy table. The area was prepped in the usual manner. The time-out was completed. The target area was identified using fluoroscopy. A 12-in long, straight, sterile hemostat was used with fluoroscopic guidance to locate the targets for each level blocked. Once located, the skin was marked with an approved surgical skin marker. Once all sites were marked, the skin (epidermis, dermis, and hypodermis), as well as deeper tissues (fat, connective tissue and muscle) were infiltrated with a small amount of a short-acting local anesthetic, loaded on a 10cc syringe with a 25G, 1.5-in  Needle. An appropriate amount of time was allowed  for local anesthetics to take effect before proceeding to the next step. Local Anesthetic: Lidocaine 2.0% The unused portion of the local anesthetic was discarded in the proper designated containers. Technical explanation of process:  L3 Medial Branch Nerve Block (MBB): The target area for the L3 medial branch is at the junction of the postero-lateral aspect of the superior articular process and the superior, posterior, and medial edge of the transverse process of L4. Under fluoroscopic guidance, a Quincke needle was inserted until contact was made with os over the superior postero-lateral aspect of the pedicular shadow (target area). After negative aspiration for blood, 92mL of the nerve block solution was injected without difficulty or complication. The needle was removed intact. L4 Medial Branch Nerve Block (MBB): The target area for the L4 medial branch is at the junction of the postero-lateral aspect of the superior articular process and the superior, posterior, and medial edge of the transverse process of L5. Under fluoroscopic guidance, a Quincke needle was inserted until contact was made with os over the superior postero-lateral aspect of the pedicular shadow (target area). After negative aspiration for blood,69mL of the nerve block solution was injected without difficulty or complication. The needle was removed intact. L5 Medial Branch Nerve Block (MBB): The target area for the L5 medial branch is at the junction of the postero-lateral aspect of the superior articular process and the superior, posterior, and medial edge of the sacral ala. Under fluoroscopic guidance, a Quincke needle was inserted until contact was made with os over the superior postero-lateral aspect of the pedicular shadow (target area). After negative aspiration for blood, 17mL of the nerve block solution was injected without difficulty or complication. The needle was removed intact. S1 Medial Branch Nerve Block (MBB): The target  area for the S1 medial branch is at the posterior and inferior 6 o'clock position of the L5-S1 facet joint. Under fluoroscopic guidance, the Quincke needle inserted for the L5 MBB was redirected until contact was made with os over the inferior and postero aspect of the sacrum, at the 6 o' clock position under the L5-S1 facet joint (Target area). After negative aspiration for blood, 84mL of the nerve block solution was injected without difficulty or complication. The needle was removed intact.  Nerve block solution: 10 cc solution made of 8 cc of 0.2% ropivacaine, 2 cc of Decadron 10 mg/cc.  1 cc injected at each level above bilaterally.  The unused portion of the solution was discarded in the proper designated containers. Procedural Needles: 22-gauge, 3.5-inch, Quincke needles used for all levels.  Once the entire procedure was completed, the treated area was cleaned, making sure to leave some of the prepping solution back to take advantage of its long term bactericidal properties.   Illustration of the posterior view of the lumbar spine and the posterior neural structures. Laminae of L2 through S1 are labeled. DPRL5, dorsal primary ramus of L5; DPRS1, dorsal primary ramus of S1; DPR3, dorsal primary ramus of L3; FJ, facet (zygapophyseal) joint L3-L4; I, inferior articular process of L4; LB1, lateral branch of dorsal primary ramus of L1; IAB, inferior articular branches from L3 medial branch (supplies L4-L5 facet joint); IBP, intermediate branch plexus; MB3, medial branch of dorsal primary ramus of L3; NR3, third lumbar nerve root; S, superior articular process of L5; SAB, superior articular branches from L4 (supplies L4-5 facet joint also); TP3, transverse process of L3.  Vitals:   11/28/18 1115 11/28/18 1126 11/28/18 1131 11/28/18 1143  BP:  94/67 (!) 101/56 121/65  Pulse: (!) 52     Resp:  14 19 18   Temp:      SpO2:  100% 100% 100%  Weight:      Height:  Start Time: 1050 hrs. End Time:  1110 hrs.  Imaging Guidance (Spinal):          Type of Imaging Technique: Fluoroscopy Guidance (Spinal) Indication(s): Assistance in needle guidance and placement for procedures requiring needle placement in or near specific anatomical locations not easily accessible without such assistance. Exposure Time: Please see nurses notes. Contrast: None used. Fluoroscopic Guidance: I was personally present during the use of fluoroscopy. "Tunnel Vision Technique" used to obtain the best possible view of the target area. Parallax error corrected before commencing the procedure. "Direction-depth-direction" technique used to introduce the needle under continuous pulsed fluoroscopy. Once target was reached, antero-posterior, oblique, and lateral fluoroscopic projection used confirm needle placement in all planes. Images permanently stored in EMR. Interpretation: No contrast injected. I personally interpreted the imaging intraoperatively. Adequate needle placement confirmed in multiple planes. Permanent images saved into the patient's record.  Antibiotic Prophylaxis:   Anti-infectives (From admission, onward)   None     Indication(s): None identified  Post-operative Assessment:  Post-procedure Vital Signs:  Pulse/HCG Rate: (!) 5261 Temp: 98.4 F (36.9 C) Resp: 18 BP: 121/65 SpO2: 100 %  EBL: None  Complications: Of note, patient did have a vasovagal reaction after the procedure were heart rate decreased to the 30s.  Blood pressure was stable.  IV was started and patient received IV fluids.  Patient noticed improvement in the recovery room.  She was eating crackers and also tolerating liquids.  Note: The patient tolerated the entire procedure well. A repeat set of vitals were taken after the procedure and the patient was kept under observation following institutional policy, for this type of procedure. Post-procedural neurological assessment was performed, showing return to baseline, prior to  discharge. The patient was provided with post-procedure discharge instructions, including a section on how to identify potential problems. Should any problems arise concerning this procedure, the patient was given instructions to immediately contact us, at any time, without hesitation. In any case, we plan to contact the patient by telephone for a follow-up status report regarding this interventional procedure.  Comments:  No additional relevant information.  Plan of Care   Imaging Orders     DG C-Arm 1-60 Min-No Report Procedure Orders    No procedure(s) ordered today    Medications ordered for procedure: Meds ordered this encounter  Medications  . ropivacaine (PF) 2 mg/mL (0.2%) (NAROPIN) injection 10 mL  . lidocaine (XYLOCAINE) 2 % (with pres) injection 400 mg  . dexamethasone (DECADRON) injection 10 mg  . dexamethasone (DECADRON) injection 10 mg   Medications administered: We administered ropivacaine (PF) 2 mg/mL (0.2%), lidocaine, dexamethasone, and dexamethasone.  See the medical record for exact dosing, route, and time of administration.  Disposition: Discharge home  Discharge Date & Time: 11/28/2018; 1146 hrs.   Physician-requested Follow-up: Return in about 17 days (around 12/15/2018) for Post Procedure Evaluation, Medication Management.  Future Appointments  Date Time Provider [place]  12/15/2018  8:45 AM Gillis Santa, MD [facility] None   Primary Care Physician: Abran Richard, MD Location: [facility] Note by: Gillis Santa, MD Date: 11/28/2018; Time: 2:07 PM  Disclaimer:  Medicine is not an exact science. The only guarantee in medicine is that nothing is guaranteed. It is important to note that the decision to proceed with this intervention was based on the information collected from the patient. The Data and conclusions were drawn from the patient's questionnaire, the interview, and the physical examination. Because the  information was provided in large  part by the patient, it cannot be guaranteed that it has not been purposely or unconsciously manipulated. Every effort has been made to obtain as much relevant data as possible for this evaluation. It is important to note that the conclusions that lead to this procedure are derived in large part from the available data. Always take into account that the treatment will also be dependent on availability of resources and existing treatment guidelines, considered by other Pain Management Practitioners as being common knowledge and practice, at the time of the intervention. For Medico-Legal purposes, it is also important to point out that variation in procedural techniques and pharmacological choices are the acceptable norm. The indications, contraindications, technique, and results of the above procedure should only be interpreted and judged by a Board-Certified Interventional Pain Specialist with extensive familiarity and expertise in the same exact procedure and technique.

## 2018-11-29 ENCOUNTER — Telehealth: Payer: Self-pay

## 2018-11-29 NOTE — Telephone Encounter (Signed)
States she is feeling much better today. States she went home yesterday and rested. She did have some dizzy spells yesterday but tried to stay hydrated. Instructed to call if neede.

## 2018-12-12 IMAGING — DX DG KNEE COMPLETE 4+V*L*
4 series · 4 of 4 positions shown · non-contrast
Comparison: None.

CLINICAL DATA: Fall

EXAM:
LEFT KNEE - COMPLETE 4+ VIEW

[knee ap (1 of 3)]
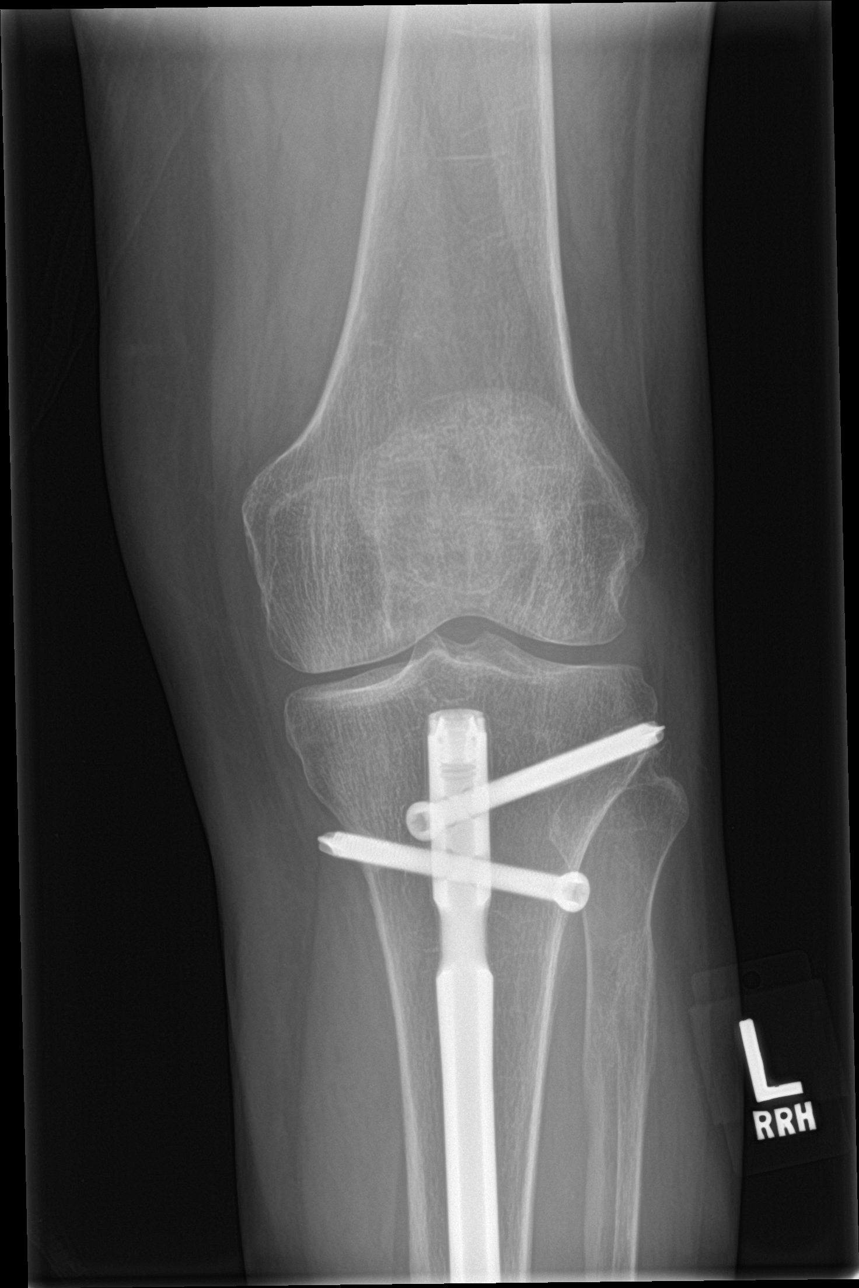

[knee ap (2 of 3)]
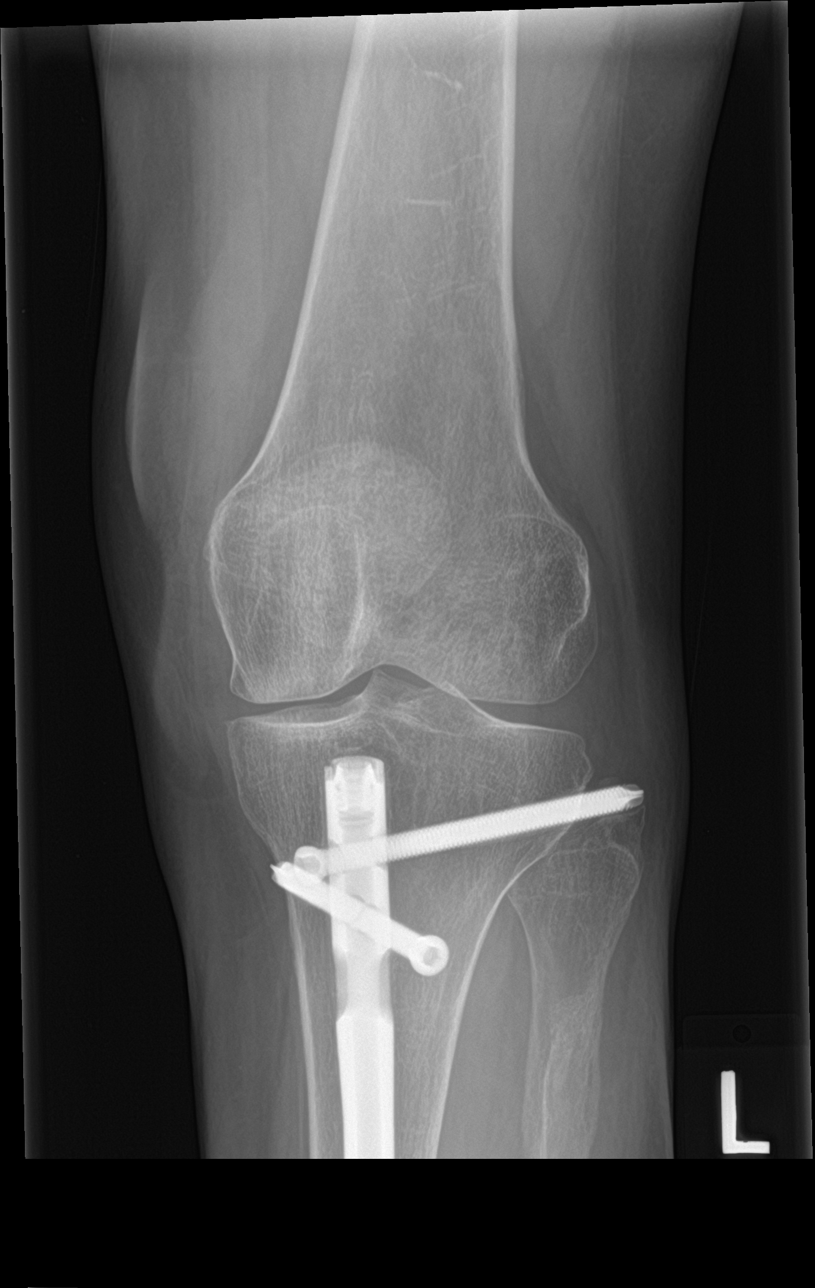

[knee ap (3 of 3)]
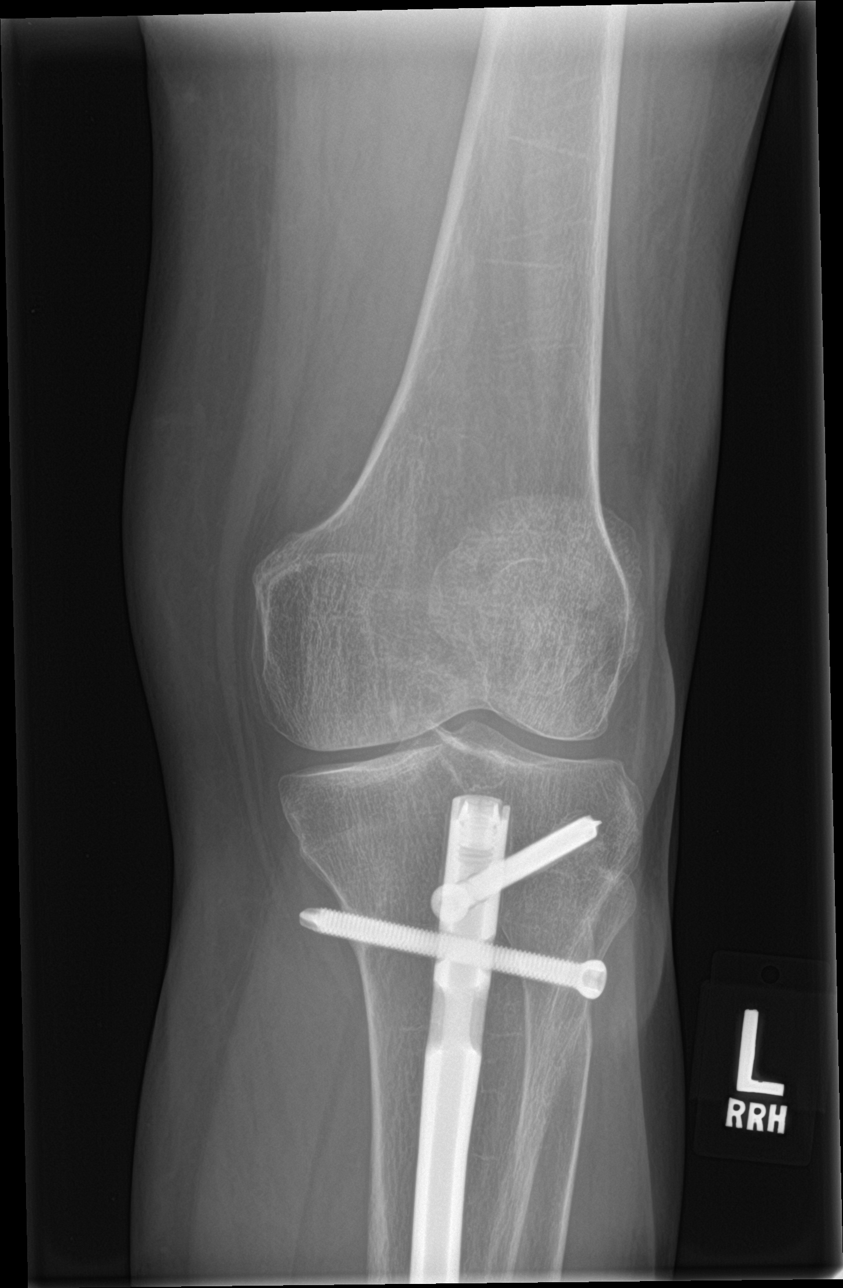

[knee lat]
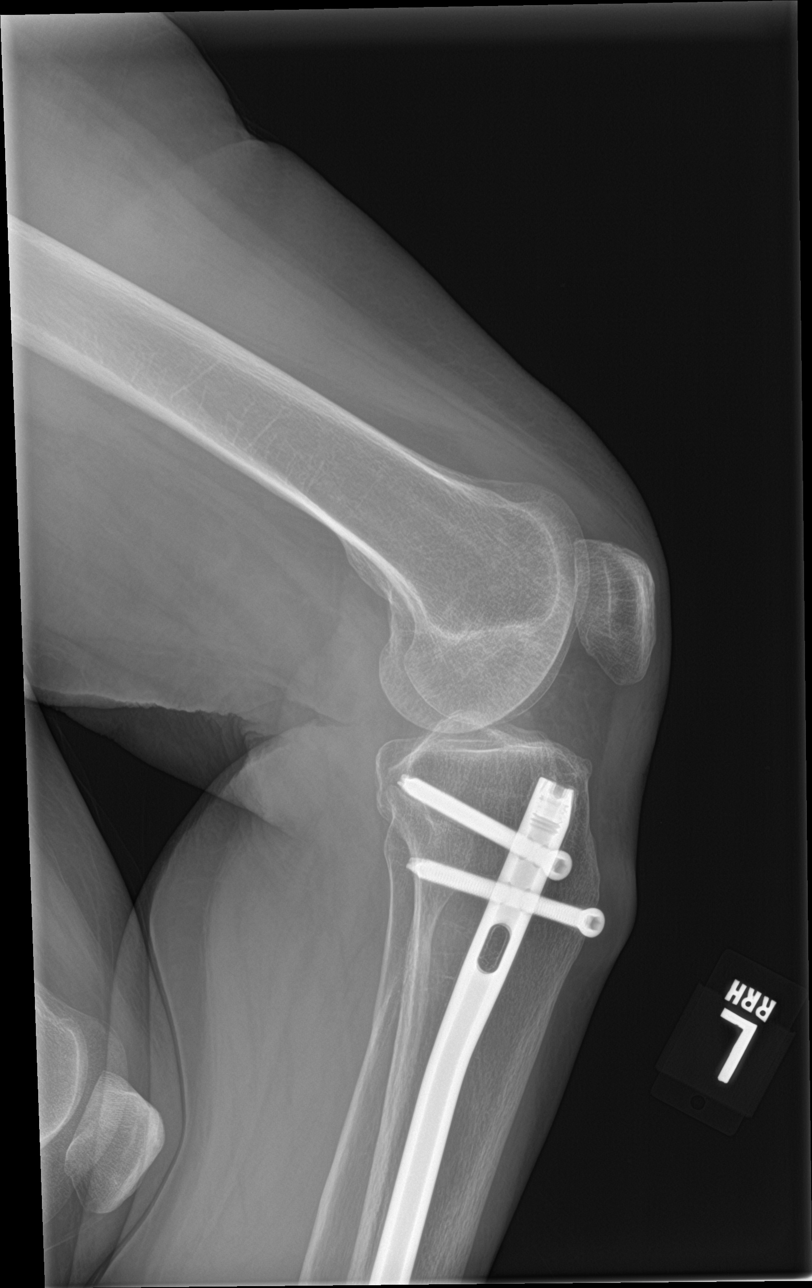

[4 of 4 positions shown; findings below may reference images not displayed]

FINDINGS: Normal alignment no acute fracture. Joint spaces intact without
effusion.

Intramedullary rod in the tibia from prior fracture fixation. 2
locking pins extend beyond the cortex of the tibia medially and
laterally. No evidence of hardware loosening. Chronic healed
fracture proximal fibula.
IMPRESSION: No acute abnormality.

## 2018-12-15 ENCOUNTER — Ambulatory Visit
Payer: Medicaid Other | Attending: Student in an Organized Health Care Education/Training Program | Admitting: Student in an Organized Health Care Education/Training Program

## 2018-12-15 ENCOUNTER — Telehealth: Payer: Self-pay

## 2018-12-15 ENCOUNTER — Other Ambulatory Visit: Payer: Self-pay

## 2018-12-15 ENCOUNTER — Encounter: Payer: Self-pay | Admitting: Student in an Organized Health Care Education/Training Program

## 2018-12-15 VITALS — BP 84/56 | HR 67 | Temp 99.0°F | Resp 18 | Wt 118.0 lb

## 2018-12-15 DIAGNOSIS — B0229 Other postherpetic nervous system involvement: Secondary | ICD-10-CM

## 2018-12-15 DIAGNOSIS — M5416 Radiculopathy, lumbar region: Secondary | ICD-10-CM

## 2018-12-15 DIAGNOSIS — G894 Chronic pain syndrome: Secondary | ICD-10-CM | POA: Diagnosis present

## 2018-12-15 DIAGNOSIS — M792 Neuralgia and neuritis, unspecified: Secondary | ICD-10-CM | POA: Diagnosis present

## 2018-12-15 DIAGNOSIS — M47816 Spondylosis without myelopathy or radiculopathy, lumbar region: Secondary | ICD-10-CM | POA: Insufficient documentation

## 2018-12-15 DIAGNOSIS — Z9889 Other specified postprocedural states: Secondary | ICD-10-CM | POA: Diagnosis not present

## 2018-12-15 DIAGNOSIS — M961 Postlaminectomy syndrome, not elsewhere classified: Secondary | ICD-10-CM | POA: Diagnosis present

## 2018-12-15 MED ORDER — KETOROLAC TROMETHAMINE 30 MG/ML IJ SOLN
30.0000 mg | Freq: Once | INTRAMUSCULAR | Status: AC
  Administered 2018-12-15: 30 mg via INTRAMUSCULAR

## 2018-12-15 MED ORDER — METHYLPREDNISOLONE 4 MG PO TBPK: ORAL_TABLET | ORAL | 0 refills | Status: AC

## 2018-12-15 MED ORDER — HYDROCODONE-ACETAMINOPHEN 7.5-325 MG PO TABS: 1.0000 | ORAL_TABLET | Freq: Every day | ORAL | 0 refills | Status: DC | PRN

## 2018-12-15 MED ORDER — KETOROLAC TROMETHAMINE 30 MG/ML IJ SOLN
INTRAMUSCULAR | Status: AC
  Filled 2018-12-15: qty 1

## 2018-12-15 NOTE — Progress Notes (Signed)
Nursing Pain Medication Assessment:  Safety precautions to be maintained throughout the outpatient stay will include: orient to surroundings, keep bed in low position, maintain call bell within reach at all times, provide assistance with transfer out of bed and ambulation.  Medication Inspection Compliance: Pill count conducted under aseptic conditions, in front of the patient. Neither the pills nor the bottle was removed from the patient's sight at any time. Once count was completed pills were immediately returned to the patient in their original bottle.  Medication: Hydrocodone/APAP Pill/Patch Count: 1.5 of 30 pills remain Pill/Patch Appearance: Markings consistent with prescribed medication Bottle Appearance: Standard pharmacy container. Clearly labeled. Filled Date: 2 / 38 / 2020 Last Medication intake:  Yesterday
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
## 2018-12-15 NOTE — Patient Instructions (Addendum)
Pt notified at 12/15/18 at 1200 that Hydrocodone/APAP and methylpredisolone has been escribed to her pharmacy.

## 2018-12-15 NOTE — Addendum Note (Signed)
Addended by: Gillis Santa on: 12/15/2018 11:35 AM   Modules accepted: Orders

## 2018-12-29 MED FILL — Ketorolac Tromethamine Inj 30 MG/ML: INTRAMUSCULAR | Qty: 1 | Status: AC

## 2019-01-17 ENCOUNTER — Telehealth: Payer: Self-pay | Admitting: Student in an Organized Health Care Education/Training Program

## 2019-01-17 NOTE — Telephone Encounter (Signed)
Pt called and stated that she needs a refill for this month and she stated that Dr Holley Raring stated he would call in her meds this month.

## 2019-01-17 NOTE — Telephone Encounter (Signed)
Patient called and stated that her pharmacy did not have her next Rx for hydrocodone - apap 7.5-325 mg, she only has picked up one Rx.  I did see where she had 2 Rx's and the last was due to be filled on 01/14/19 to last until 02/13/19.    Called to [facility] and they do have the Rx on hand.  Stated that it would require PA but patient had been paying cash for Rx.  I will let patient know and see if she would like for me to send for PA.    Patient is going to pick up medication today, I will send a PA in an effort for approval for next time.

## 2019-02-07 ENCOUNTER — Other Ambulatory Visit: Payer: Self-pay

## 2019-02-07 ENCOUNTER — Ambulatory Visit: Payer: Medicaid Other | Attending: Nurse Practitioner | Admitting: Nurse Practitioner

## 2019-02-07 DIAGNOSIS — G894 Chronic pain syndrome: Secondary | ICD-10-CM

## 2019-02-07 DIAGNOSIS — B0229 Other postherpetic nervous system involvement: Secondary | ICD-10-CM | POA: Diagnosis not present

## 2019-02-07 DIAGNOSIS — M47816 Spondylosis without myelopathy or radiculopathy, lumbar region: Secondary | ICD-10-CM | POA: Diagnosis not present

## 2019-02-07 MED ORDER — HYDROCODONE-ACETAMINOPHEN 7.5-325 MG PO TABS: 1.0000 | ORAL_TABLET | Freq: Every day | ORAL | 0 refills | Status: DC | PRN

## 2019-02-07 NOTE — Progress Notes (Signed)
Pain Management Encounter Note - Virtual Visit via Telephone Telehealth (real-time audio visits between healthcare provider and patient).  Patient's Phone No. & Preferred Pharmacy:  [phone] (home); [phone] (mobile); (Preferred) [phone]  [place], [place] - [address] [name] [address] Phone: [phone] Fax: [phone]   Pre-screening note:  Our staff contacted Dana Goodwin and offered her an "in person", "face-to-face" appointment versus a telephone encounter. She indicated preferring the telephone encounter, at this time.  Reason for Virtual Visit: COVID-19*  Social distancing based on CDC and AMA recommendations.   I contacted Dana Goodwin on 02/07/2019 at 8:57 AM by telephone and clearly identified myself as Dionisio David, NP. I verified that I was speaking with the correct person using two identifiers (Name and date of birth: 04/11/1961).  Advanced Informed Consent I sought verbal advanced consent from Dana Goodwin for telemedicine interactions and virtual visit. I informed Dana Goodwin of the security and privacy concerns, risks, and limitations associated with performing an evaluation and management service by telephone. I also informed Dana Goodwin of the availability of "in person" appointments and I informed her of the possibility of a patient responsible charge related to this service. Ms. Dech expressed understanding and agreed to proceed.   Historic Elements   Dana Goodwin is a 58 y.o. year old, female patient evaluated today after her last encounter by our practice on 01/17/2019. Dana Goodwin  has a past medical history of Chronic back pain, History of colon polyps, Numbness, Sciatica, and Urinary frequency. She also  has a past surgical history that includes Colonoscopy (N/A, 06/19/2016); Back surgery; Fracture surgery (Left); and Lumbar laminectomy/decompression microdiscectomy (Left, 12/30/2016). Dana Goodwin has a  current medication list which includes the following prescription(s): acetaminophen, hydrocodone-acetaminophen, hydrocodone-acetaminophen, ibuprofen, and pregabalin. She  reports that she has been smoking. She has a 18.50 pack-year smoking history. She has never used smokeless tobacco. She reports that she does not drink alcohol or use drugs. Dana Goodwin is allergic to no known allergies.   HPI  I last saw her on Visit date not found. She is being evaluated for medication management. She is having 5/10 low back pain. She also has left ankle and foot pain. She has numbness. She denies any tingling. She has some weakness. She feels like the pain is about the same. However she has noticed pain on the top of her foot more. She feels like this was once more on the side. She denies any other concerns. She denies any side effects of her medications.   Pharmacotherapy Assessment  Analgesic: Hydrocodone-Acetamin 7.5-325  MME/day: 14.67 mg/day.   Monitoring: Pharmacotherapy: No side-effects or adverse reactions reported. [place] PMP: PDMP reviewed during this encounter.       Compliance: No problems identified. Plan: Refer to "POC".  Review of recent tests  DG C-Arm 1-60 Min-No Report Fluoroscopy was utilized by the requesting physician.  No radiographic  interpretation.    Office Visit on 08/18/2018  Component Date Value Ref Range Status  . Summary 08/18/2018 FINAL   Final   Comment: ==================================================================== TOXASSURE COMP DRUG ANALYSIS,UR ==================================================================== Test                             Result       Flag       Units Drug Present and Declared for Prescription Verification   Hydrocodone  1106         EXPECTED   ng/mg creat   Hydromorphone                  361          EXPECTED   ng/mg creat   Dihydrocodeine                 166          EXPECTED   ng/mg creat   Norhydrocodone                  2189         EXPECTED   ng/mg creat    Sources of hydrocodone include scheduled prescription    medications. Hydromorphone, dihydrocodeine and norhydrocodone are    expected metabolites of hydrocodone. Hydromorphone and    dihydrocodeine are also available as scheduled prescription    medications.   Acetaminophen                  PRESENT      EXPECTED   Ibuprofen                      PRESENT      EXPECTED Drug Absent but Declared for Prescription Veri                          fication   Gabapentin                     Not Detected UNEXPECTED ==================================================================== Test                      Result    Flag   Units      Ref Range   Creatinine              99               mg/dL      >=20 ==================================================================== Declared Medications:  The flagging and interpretation on this report are based on the  following declared medications.  Unexpected results may arise from  inaccuracies in the declared medications.  **Note: The testing scope of this panel includes these medications:  Gabapentin  Hydrocodone (Hydrocodone-Acetaminophen)  **Note: The testing scope of this panel does not include small to  moderate amounts of these reported medications:  Acetaminophen  Acetaminophen (Hydrocodone-Acetaminophen)  Ibuprofen  **Note: The testing scope of this panel does not include following  reported medications:  Prednisone ============================================                          ======================== For clinical consultation, please call [phone]. ====================================================================    Assessment  The primary encounter diagnosis was Lumbar facet arthropathy. Diagnoses of Lumbar spondylosis, Postherpetic neuralgia (shingles Oct 2019 Left L4/5 dermatome), and Chronic pain syndrome were also pertinent to this visit.  Plan of Care  I am having Shatasia P.  Arentz maintain her ibuprofen, acetaminophen, pregabalin, HYDROcodone-acetaminophen, and HYDROcodone-acetaminophen.  Pharmacotherapy (Medications Ordered): Meds ordered this encounter  Medications  . HYDROcodone-acetaminophen (NORCO) 7.5-325 MG tablet    Sig: Take 1-2 tablets by mouth daily as needed for up to 30 days for moderate pain.    Dispense:  45 tablet    Refill:  0    Do not place this medication, or any other prescription from our practice, on "Automatic Refill".  Patient may have prescription filled one day early if pharmacy is closed on scheduled refill date.    Order Specific Question:   Supervising Provider    Answer:   Milinda Pointer [phone]  . HYDROcodone-acetaminophen (NORCO) 7.5-325 MG tablet    Sig: Take 1-2 tablets by mouth daily as needed for up to 30 days for severe pain. Must last 30 days.    Dispense:  45 tablet    Refill:  0    [place] STOP ACT - Not applicable. Fill one day early if pharmacy is closed on scheduled refill date.    Order Specific Question:   Supervising Provider    Answer:   Milinda Pointer [phone]   Orders:  No orders of the defined types were placed in this encounter.  Follow-up plan:   Return in about 2 months (around 04/09/2019) for MedMgmt.   I discussed the assessment and treatment plan with the patient. The patient was provided an opportunity to ask questions and all were answered. The patient agreed with the plan and demonstrated an understanding of the instructions.  Patient advised to call back or seek an in-person evaluation if the symptoms or condition worsens.  Total duration of non-face-to-face encounter: 12 minutes.  Note by: Dionisio David, NP Date: 02/07/2019; Time: 8:57 AM  Disclaimer:  * Given the special circumstances of the COVID-19 pandemic, the federal government has announced that the Office for Civil Rights (OCR) will exercise its enforcement discretion and will not impose penalties on physicians using telehealth in  the event of noncompliance with regulatory requirements under the [place] and [place] (HIPAA) in connection with the good faith provision of telehealth during the PYKDX-83 national public health emergency. ([place])

## 2019-02-07 NOTE — Patient Instructions (Signed)
____________________________________________________________________________________________  Medication Rules  Purpose: To inform patients, and their family members, of our rules and regulations.  Applies to: All patients receiving prescriptions (written or electronic).  Pharmacy of record: Pharmacy where electronic prescriptions will be sent. If written prescriptions are taken to a different pharmacy, please inform the nursing staff. The pharmacy listed in the electronic medical record should be the one where you would like electronic prescriptions to be sent.  Electronic prescriptions: In compliance with the [place] Strengthen Opioid Misuse Prevention (STOP) Act of 2017 (Session Law 2017-74/H243), effective October 05, 2018, all controlled substances must be electronically prescribed. Calling prescriptions to the pharmacy will cease to exist.  Prescription refills: Only during scheduled appointments. Applies to all prescriptions.  NOTE: The following applies primarily to controlled substances (Opioid* Pain Medications).   Patient's responsibilities: 1. Pain Pills: Bring all pain pills to every appointment (except for procedure appointments). 2. Pill Bottles: Bring pills in original pharmacy bottle. Always bring the newest bottle. Bring bottle, even if empty. 3. Medication refills: You are responsible for knowing and keeping track of what medications you take and those you need refilled. The day before your appointment: write a list of all prescriptions that need to be refilled. The day of the appointment: give the list to the admitting nurse. Prescriptions will be written only during appointments. No prescriptions will be written on procedure days. If you forget a medication: it will not be "Called in", "Faxed", or "electronically sent". You will need to get another appointment to get these prescribed. No early refills. Do not call asking to have your prescription filled  early. 4. Prescription Accuracy: You are responsible for carefully inspecting your prescriptions before leaving our office. Have the discharge nurse carefully go over each prescription with you, before taking them home. Make sure that your name is accurately spelled, that your address is correct. Check the name and dose of your medication to make sure it is accurate. Check the number of pills, and the written instructions to make sure they are clear and accurate. Make sure that you are given enough medication to last until your next medication refill appointment. 5. Taking Medication: Take medication as prescribed. When it comes to controlled substances, taking less pills or less frequently than prescribed is permitted and encouraged. Never take more pills than instructed. Never take medication more frequently than prescribed.  6. Inform other Doctors: Always inform, all of your healthcare providers, of all the medications you take. 7. Pain Medication from other Providers: You are not allowed to accept any additional pain medication from any other Doctor or Healthcare provider. There are two exceptions to this rule. (see below) In the event that you require additional pain medication, you are responsible for notifying us, as stated below. 8. Medication Agreement: You are responsible for carefully reading and following our Medication Agreement. This must be signed before receiving any prescriptions from our practice. Safely store a copy of your signed Agreement. Violations to the Agreement will result in no further prescriptions. (Additional copies of our Medication Agreement are available upon request.) 9. Laws, Rules, & Regulations: All patients are expected to follow all Federal and State Laws, Statutes, Rules, & Regulations. Ignorance of the Laws does not constitute a valid excuse. The use of any illegal substances is prohibited. 10. Adopted CDC guidelines & recommendations: Target dosing levels will be  at or below 60 MME/day. Use of benzodiazepines** is not recommended.  Exceptions: There are only two exceptions to the rule of not   receiving pain medications from other Healthcare Providers. 1. Exception #1 (Emergencies): In the event of an emergency (i.e.: accident requiring emergency care), you are allowed to receive additional pain medication. However, you are responsible for: As soon as you are able, call our office [phone], at any time of the day or night, and leave a message stating your name, the date and nature of the emergency, and the name and dose of the medication prescribed. In the event that your call is answered by a member of our staff, make sure to document and save the date, time, and the name of the person that took your information.  2. Exception #2 (Planned Surgery): In the event that you are scheduled by another doctor or dentist to have any type of surgery or procedure, you are allowed (for a period no longer than 30 days), to receive additional pain medication, for the acute post-op pain. However, in this case, you are responsible for picking up a copy of our "Post-op Pain Management for Surgeons" handout, and giving it to your surgeon or dentist. This document is available at our office, and does not require an appointment to obtain it. Simply go to our office during business hours (Monday-Thursday from 8:00 AM to 4:00 PM) (Friday 8:00 AM to 12:00 Noon) or if you have a scheduled appointment with us, prior to your surgery, and ask for it by name. In addition, you will need to provide us with your name, name of your surgeon, type of surgery, and date of procedure or surgery.  *Opioid medications include: morphine, codeine, oxycodone, oxymorphone, hydrocodone, hydromorphone, meperidine, tramadol, tapentadol, buprenorphine, fentanyl, methadone. **Benzodiazepine medications include: diazepam (Valium), alprazolam (Xanax), clonazepam (Klonopine), lorazepam (Ativan), clorazepate  (Tranxene), chlordiazepoxide (Librium), estazolam (Prosom), oxazepam (Serax), temazepam (Restoril), triazolam (Halcion) (Last updated: 12/02/2017) ____________________________________________________________________________________________    

## 2019-04-05 ENCOUNTER — Encounter: Payer: Self-pay | Admitting: Student in an Organized Health Care Education/Training Program

## 2019-04-05 ENCOUNTER — Ambulatory Visit
Payer: Medicaid Other | Attending: Student in an Organized Health Care Education/Training Program | Admitting: Student in an Organized Health Care Education/Training Program

## 2019-04-05 ENCOUNTER — Other Ambulatory Visit: Payer: Self-pay

## 2019-04-05 DIAGNOSIS — M47816 Spondylosis without myelopathy or radiculopathy, lumbar region: Secondary | ICD-10-CM

## 2019-04-05 DIAGNOSIS — M792 Neuralgia and neuritis, unspecified: Secondary | ICD-10-CM

## 2019-04-05 DIAGNOSIS — M961 Postlaminectomy syndrome, not elsewhere classified: Secondary | ICD-10-CM

## 2019-04-05 DIAGNOSIS — G894 Chronic pain syndrome: Secondary | ICD-10-CM

## 2019-04-05 DIAGNOSIS — Z9889 Other specified postprocedural states: Secondary | ICD-10-CM | POA: Diagnosis not present

## 2019-04-05 DIAGNOSIS — B0229 Other postherpetic nervous system involvement: Secondary | ICD-10-CM | POA: Diagnosis not present

## 2019-04-05 DIAGNOSIS — M5416 Radiculopathy, lumbar region: Secondary | ICD-10-CM

## 2019-04-05 MED ORDER — HYDROCODONE-ACETAMINOPHEN 7.5-325 MG PO TABS: 1.0000 | ORAL_TABLET | Freq: Every day | ORAL | 0 refills | Status: DC | PRN

## 2019-04-05 NOTE — Progress Notes (Signed)
Pain Management Virtual Encounter Note - Virtual Visit via Telephone Telehealth (real-time audio visits between healthcare provider and patient).   Patient's Phone No. & Preferred Pharmacy:  [phone] (home); [phone] (mobile); (Preferred) [phone] [email or web address]  [place], [place] - [address] [place] [address] Phone: [phone] Fax: [phone]    Pre-screening note:  Our staff contacted Ms. Hanif and offered her an "in person", "face-to-face" appointment versus a telephone encounter. She indicated preferring the telephone encounter, at this time.   Reason for Virtual Visit: COVID-19*  Social distancing based on CDC and AMA recommendations.   I contacted Verne Carrow on 04/05/2019 via telephone.      I clearly identified myself as Gillis Santa, MD. I verified that I was speaking with the correct person using two identifiers (Name: LAQUASIA PINCUS, and date of birth: Oct 23, 1960).  Advanced Informed Consent I sought verbal advanced consent from Verne Carrow for virtual visit interactions. I informed Ms. Babino of possible security and privacy concerns, risks, and limitations associated with providing "not-in-person" medical evaluation and management services. I also informed Ms. Satcher of the availability of "in-person" appointments. Finally, I informed her that there would be a charge for the virtual visit and that she could be  personally, fully or partially, financially responsible for it. Ms. Warshawsky expressed understanding and agreed to proceed.   Historic Elements   Ms. HANIN DECOOK is a 58 y.o. year old, female patient evaluated today after her last encounter by our practice on 02/07/2019. Ms. Pincock  has a past medical history of Chronic back pain, History of colon polyps, Numbness, Sciatica, and Urinary frequency. She also  has a past surgical history that includes Colonoscopy (N/A, 06/19/2016); Back surgery;  Fracture surgery (Left); and Lumbar laminectomy/decompression microdiscectomy (Left, 12/30/2016). Ms. Colasanti has a current medication list which includes the following prescription(s): acetaminophen, hydrocodone-acetaminophen, hydrocodone-acetaminophen, ibuprofen, and pregabalin. She  reports that she has been smoking. She has a 18.50 pack-year smoking history. She has never used smokeless tobacco. She reports that she does not drink alcohol or use drugs. Ms. Nield is allergic to no known allergies.   HPI  Today, she is being contacted for medication management.  No change in medical history since last visit.  Patient's pain is at baseline.  Patient continues multimodal pain regimen as prescribed.  States that it provides pain relief and improvement in functional status.   Pharmacotherapy Assessment  Analgesic:   03/15/2019  1   02/07/2019  Hydrocodone-Acetamin 7.5-325  45.00 30 Cr Kin   [id number]   Nor (6917)   0  11.25 MME  Medicaid   [place]   }   Monitoring: Pharmacotherapy: No side-effects or adverse reactions reported. [place] PMP: PDMP reviewed during this encounter.       Compliance: No problems identified. Effectiveness: Clinically acceptable. Plan: Refer to "POC".  Pertinent Labs   SAFETY SCREENING Profile Lab Results  Component Value Date   STAPHAUREUS NEGATIVE 12/23/2016   MRSAPCR NEGATIVE 12/23/2016   Renal Function Lab Results  Component Value Date   BUN 13 10/20/2017   CREATININE 0.67 10/20/2017   GFRAA >60 10/20/2017   GFRNONAA >60 10/20/2017   Hepatic Function No results found for: AST, ALT, ALBUMIN UDS Summary  Date Value Ref Range Status  08/18/2018 FINAL  Final    Comment:    ==================================================================== TOXASSURE COMP DRUG ANALYSIS,UR ==================================================================== Test  Result       Flag       Units Drug Present and Declared for Prescription  Verification   Hydrocodone                    1106         EXPECTED   ng/mg creat   Hydromorphone                  361          EXPECTED   ng/mg creat   Dihydrocodeine                 166          EXPECTED   ng/mg creat   Norhydrocodone                 2189         EXPECTED   ng/mg creat    Sources of hydrocodone include scheduled prescription    medications. Hydromorphone, dihydrocodeine and norhydrocodone are    expected metabolites of hydrocodone. Hydromorphone and    dihydrocodeine are also available as scheduled prescription    medications.   Acetaminophen                  PRESENT      EXPECTED   Ibuprofen                      PRESENT      EXPECTED Drug Absent but Declared for Prescription Verification   Gabapentin                     Not Detected UNEXPECTED ==================================================================== Test                      Result    Flag   Units      Ref Range   Creatinine              99               mg/dL      >=20 ==================================================================== Declared Medications:  The flagging and interpretation on this report are based on the  following declared medications.  Unexpected results may arise from  inaccuracies in the declared medications.  **Note: The testing scope of this panel includes these medications:  Gabapentin  Hydrocodone (Hydrocodone-Acetaminophen)  **Note: The testing scope of this panel does not include small to  moderate amounts of these reported medications:  Acetaminophen  Acetaminophen (Hydrocodone-Acetaminophen)  Ibuprofen  **Note: The testing scope of this panel does not include following  reported medications:  Prednisone ==================================================================== For clinical consultation, please call [phone]. ====================================================================    Note: Above Lab results reviewed.   Assessment  The primary  encounter diagnosis was Lumbar facet arthropathy. Diagnoses of Lumbar spondylosis, Postherpetic neuralgia (shingles Oct 2019 Left L4/5 dermatome), Post laminectomy syndrome, History of lumbar surgery left L3-L4 laminotomy and microdiscectomy October 2018, Lumbar radiculopathy, Neuropathic pain, and Chronic pain syndrome were also pertinent to this visit.  Plan of Care  I have changed Deyanira P. Cassaro's HYDROcodone-acetaminophen. I am also having her start on HYDROcodone-acetaminophen. Additionally, I am having her maintain her ibuprofen, acetaminophen, and pregabalin.  Pharmacotherapy (Medications Ordered): Meds ordered this encounter  Medications  . HYDROcodone-acetaminophen (NORCO) 7.5-325 MG tablet    Sig: Take 1-2 tablets by mouth daily as needed for severe pain. Must last 30 days.  Dispense:  45 tablet    Refill:  0    [place] STOP ACT - Not applicable. Fill one day early if pharmacy is closed on scheduled refill date.  Marland Kitchen HYDROcodone-acetaminophen (NORCO) 7.5-325 MG tablet    Sig: Take 1-2 tablets by mouth daily as needed for severe pain. Must last 30 days.    Dispense:  45 tablet    Refill:  0    [place] STOP ACT - Not applicable. Fill one day early if pharmacy is closed on scheduled refill date.   Orders:  No orders of the defined types were placed in this encounter.  Follow-up plan:   Return in about 8 weeks (around 05/31/2019) for Medication Management.        Recent Visits Date Type Provider Dept  02/07/19 Office Visit Vevelyn Francois, NP [facility]  Showing recent visits within past 90 days and meeting all other requirements   Today's Visits Date Type Provider Dept  04/05/19 Office Visit Gillis Santa, MD [facility]  Showing today's visits and meeting all other requirements   Future Appointments No visits were found meeting these conditions.  Showing future appointments within next 90 days and meeting all other requirements   I discussed the assessment and  treatment plan with the patient. The patient was provided an opportunity to ask questions and all were answered. The patient agreed with the plan and demonstrated an understanding of the instructions.  Patient advised to call back or seek an in-person evaluation if the symptoms or condition worsens.  Total duration of non-face-to-face encounter: 25 minutes.  Note by: Gillis Santa, MD Date: 04/05/2019; Time: 3:15 PM  Note: This dictation was prepared with Dragon dictation. Any transcriptional errors that may result from this process are unintentional.  Disclaimer:  * Given the special circumstances of the COVID-19 pandemic, the federal government has announced that the Office for Civil Rights (OCR) will exercise its enforcement discretion and will not impose penalties on physicians using telehealth in the event of noncompliance with regulatory requirements under the [place] and [place] (HIPAA) in connection with the good faith provision of telehealth during the [place] national public health emergency. ([place])

## 2019-05-30 ENCOUNTER — Encounter: Payer: Self-pay | Admitting: Student in an Organized Health Care Education/Training Program

## 2019-06-01 ENCOUNTER — Other Ambulatory Visit: Payer: Self-pay

## 2019-06-01 ENCOUNTER — Ambulatory Visit
Payer: Medicaid Other | Attending: Student in an Organized Health Care Education/Training Program | Admitting: Student in an Organized Health Care Education/Training Program

## 2019-06-01 ENCOUNTER — Encounter: Payer: Self-pay | Admitting: Student in an Organized Health Care Education/Training Program

## 2019-06-01 DIAGNOSIS — M47816 Spondylosis without myelopathy or radiculopathy, lumbar region: Secondary | ICD-10-CM

## 2019-06-01 DIAGNOSIS — M792 Neuralgia and neuritis, unspecified: Secondary | ICD-10-CM

## 2019-06-01 DIAGNOSIS — Z9889 Other specified postprocedural states: Secondary | ICD-10-CM | POA: Diagnosis not present

## 2019-06-01 DIAGNOSIS — G894 Chronic pain syndrome: Secondary | ICD-10-CM

## 2019-06-01 DIAGNOSIS — B0229 Other postherpetic nervous system involvement: Secondary | ICD-10-CM | POA: Diagnosis not present

## 2019-06-01 DIAGNOSIS — M961 Postlaminectomy syndrome, not elsewhere classified: Secondary | ICD-10-CM

## 2019-06-01 MED ORDER — HYDROCODONE-ACETAMINOPHEN 7.5-325 MG PO TABS: 1.0000 | ORAL_TABLET | Freq: Every day | ORAL | 0 refills | Status: DC | PRN

## 2019-06-01 NOTE — Progress Notes (Signed)
Pain Management Virtual Encounter Note - Virtual Visit via [place] (real-time audio visits between healthcare provider and patient).   Patient's Phone No. & Preferred Pharmacy:  [phone] (home); [phone] (mobile); (Preferred) [phone] [email or web address]  [place], [place] - [address] [place] [address] Phone: [phone] Fax: [phone]    Pre-screening note:  Our staff contacted Dana Goodwin and offered her an "in person", "face-to-face" appointment versus a telephone encounter. She indicated preferring the telephone encounter, at this time.   Reason for Virtual Visit: COVID-19*  Social distancing based on CDC and AMA recommendations.   I contacted Dana Goodwin on 06/01/2019 via video conference.      I clearly identified myself as Gillis Santa, MD. I verified that I was speaking with the correct person using two identifiers (Name: Dana Goodwin, and date of birth: [birth year]).  Advanced Informed Consent I sought verbal advanced consent from Dana Goodwin for virtual visit interactions. I informed Dana Goodwin of possible security and privacy concerns, risks, and limitations associated with providing "not-in-person" medical evaluation and management services. I also informed Dana Goodwin of the availability of "in-person" appointments. Finally, I informed her that there would be a charge for the virtual visit and that she could be  personally, fully or partially, financially responsible for it. Dana Goodwin expressed understanding and agreed to proceed.   Historic Elements   Dana Goodwin is a 58 y.o. year old, female patient evaluated today after her last encounter by our practice on 04/05/2019. Dana Goodwin  has a past medical history of Chronic back pain, History of colon polyps, Numbness, Sciatica, and Urinary frequency. She also  has a past surgical history that includes Colonoscopy (N/A, 06/19/2016);  Back surgery; Fracture surgery (Left); and Lumbar laminectomy/decompression microdiscectomy (Left, 12/30/2016). Dana Goodwin has a current medication list which includes the following prescription(s): acetaminophen, hydrocodone-acetaminophen, hydrocodone-acetaminophen, and ibuprofen. She  reports that she has been smoking. She has a 18.50 pack-year smoking history. She has never used smokeless tobacco. She reports that she does not drink alcohol or use drugs. Dana Goodwin is allergic to no known allergies.   HPI  Today, she is being contacted for medication management.   No change in medical history since last visit.  Patient's pain is at baseline.  Patient continues multimodal pain regimen as prescribed.  States that it provides pain relief and improvement in functional status.   Pharmacotherapy Assessment  Analgesic:  05/15/2019  1   04/05/2019  Hydrocodone-Acetamin 7.5-325  45.00 30 Un Pha   [id number]   [place] ([id number])   0  11.25 MME  Medicaid   [facility]  04/13/2019  1   04/05/2019  Hydrocodone-Acetamin 7.5-325  45.00 30 Un Pha   [id number]   [place] ([id number])   0  11.25 MME  Medicaid   [place]    Monitoring: Pharmacotherapy: No side-effects or adverse reactions reported. [place] PMP: PDMP reviewed during this encounter.       Compliance: No problems identified. Effectiveness: Clinically acceptable. Plan: Refer to "POC".  UDS:  Summary  Date Value Ref Range Status  08/18/2018 FINAL  Final    Comment:    ==================================================================== TOXAS[facility]RE COMP DRUG ANALYSIS,UR ==================================================================== Test                             Result       Flag       Units Drug Present  and Declared for Prescription Verification   Hydrocodone                    1106         EXPECTED   ng/mg creat   Hydromorphone                  361          EXPECTED   ng/mg creat   Dihydrocodeine                 166          EXPECTED   ng/mg creat   Norhydrocodone                  2189         EXPECTED   ng/mg creat    Sources of hydrocodone include scheduled prescription    medications. Hydromorphone, dihydrocodeine and norhydrocodone are    expected metabolites of hydrocodone. Hydromorphone and    dihydrocodeine are also available as scheduled prescription    medications.   Acetaminophen                  PRESENT      EXPECTED   Ibuprofen                      PRESENT      EXPECTED Drug Absent but Declared for Prescription Verification   Gabapentin                     Not Detected UNEXPECTED ==================================================================== Test                      Result    Flag   Units      Ref Range   Creatinine              99               mg/dL      >=20 ==================================================================== Declared Medications:  The flagging and interpretation on this report are based on the  following declared medications.  Unexpected results may arise from  inaccuracies in the declared medications.  **Note: The testing scope of this panel includes these medications:  Gabapentin  Hydrocodone (Hydrocodone-Acetaminophen)  **Note: The testing scope of this panel does not include small to  moderate amounts of these reported medications:  Acetaminophen  Acetaminophen (Hydrocodone-Acetaminophen)  Ibuprofen  **Note: The testing scope of this panel does not include following  reported medications:  Prednisone ==================================================================== For clinical consultation, please call [phone]. ====================================================================    Laboratory Chemistry Profile (12 mo)  Renal: No results found for requested labs within last 8760 hours.  Lab Results  Component Value Date   GFRAA >60 10/20/2017   GFRNONAA >60 10/20/2017   Hepatic: No results found for requested labs within last 8760 hours. No results found for: AST, ALT Other: No results  found for requested labs within last 8760 hours. Note: Above Lab results reviewed.  Assessment  The primary encounter diagnosis was Lumbar facet arthropathy. Diagnoses of Lumbar spondylosis, Postherpetic neuralgia (shingles Oct 2019 Left L4/5 dermatome), Post laminectomy syndrome, History of lumbar surgery left L3-L4 laminotomy and microdiscectomy October 2018, Neuropathic pain, and Chronic pain syndrome were also pertinent to this visit.  Plan of Care  I have discontinued Dana Goodwin's pregabalin. I am also having her start on HYDROcodone-acetaminophen. Additionally, I am having her maintain  her ibuprofen, acetaminophen, and HYDROcodone-acetaminophen. No benefit with Lyrica.  Patient has discontinued.  Patient has tried L3-L4 ESI, not effective.  Pharmacotherapy (Medications Ordered): Meds ordered this encounter  Medications  . HYDROcodone-acetaminophen (NORCO) 7.5-325 MG tablet    Sig: Take 1-2 tablets by mouth daily as needed for severe pain. Must last 30 days.    Dispense:  45 tablet    Refill:  0    Ages STOP ACT - Not applicable. Fill one day early if pharmacy is closed on scheduled refill date.  Marland Kitchen HYDROcodone-acetaminophen (NORCO) 7.5-325 MG tablet    Sig: Take 1-2 tablets by mouth daily as needed for severe pain. Must last 30 days.    Dispense:  45 tablet    Refill:  0    [place] STOP ACT - Not applicable. Fill one day early if pharmacy is closed on scheduled refill date.    Follow-up plan:   Return in about 8 weeks (around 07/27/2019) for Medication Management, in person (needs UDS).    Recent Visits Date Type Provider Dept  04/05/19 Office Visit Gillis Santa, MD [facility]  Showing recent visits within past 90 days and meeting all other requirements   Today's Visits Date Type Provider Dept  06/01/19 Office Visit Gillis Santa, MD [facility]  Showing today's visits and meeting all other requirements   Future Appointments No visits were found meeting  these conditions.  Showing future appointments within next 90 days and meeting all other requirements   I discussed the assessment and treatment plan with the patient. The patient was provided an opportunity to ask questions and all were answered. The patient agreed with the plan and demonstrated an understanding of the instructions.  Patient advised to call back or seek an in-person evaluation if the symptoms or condition worsens.  Total duration of non-face-to-face encounter: 25 minutes.  Note by: Gillis Santa, MD Date: 06/01/2019; Time: 3:26 PM  Note: This dictation was prepared with Dragon dictation. Any transcriptional errors that may result from this process are unintentional.  Disclaimer:  * Given the special circumstances of the COVID-19 pandemic, the federal government has announced that the Office for Civil Rights (OCR) will exercise its enforcement discretion and will not impose penalties on physicians using telehealth in the event of noncompliance with regulatory requirements under the [place] (HIPAA) in connection with the good faith provision of telehealth during the XX123456 national public health emergency. ([place])

## 2019-07-06 ENCOUNTER — Ambulatory Visit
Payer: Medicaid Other | Attending: Student in an Organized Health Care Education/Training Program | Admitting: Student in an Organized Health Care Education/Training Program

## 2019-07-06 ENCOUNTER — Encounter: Payer: Self-pay | Admitting: Student in an Organized Health Care Education/Training Program

## 2019-07-06 ENCOUNTER — Telehealth: Payer: Self-pay

## 2019-07-06 ENCOUNTER — Other Ambulatory Visit: Payer: Self-pay

## 2019-07-06 VITALS — BP 134/101 | HR 78 | Temp 98.9°F | Resp 16 | Ht 64.0 in | Wt 118.0 lb

## 2019-07-06 DIAGNOSIS — M5416 Radiculopathy, lumbar region: Secondary | ICD-10-CM

## 2019-07-06 DIAGNOSIS — Z9889 Other specified postprocedural states: Secondary | ICD-10-CM | POA: Diagnosis not present

## 2019-07-06 DIAGNOSIS — M961 Postlaminectomy syndrome, not elsewhere classified: Secondary | ICD-10-CM | POA: Diagnosis not present

## 2019-07-06 DIAGNOSIS — G894 Chronic pain syndrome: Secondary | ICD-10-CM | POA: Diagnosis not present

## 2019-07-06 MED ORDER — GABAPENTIN 300 MG PO CAPS: 300.0000 mg | ORAL_CAPSULE | Freq: Two times a day (BID) | ORAL | 2 refills | Status: DC | PRN

## 2019-07-06 MED ORDER — HYDROCODONE-ACETAMINOPHEN 7.5-325 MG PO TABS: 1.0000 | ORAL_TABLET | Freq: Every day | ORAL | 0 refills | Status: DC | PRN

## 2019-07-06 NOTE — Progress Notes (Signed)
Nursing Pain Medication Assessment:  Safety precautions to be maintained throughout the outpatient stay will include: orient to surroundings, keep bed in low position, maintain call bell within reach at all times, provide assistance with transfer out of bed and ambulation.  Medication Inspection Compliance: Pill count conducted under aseptic conditions, in front of the patient. Neither the pills nor the bottle was removed from the patient's sight at any time. Once count was completed pills were immediately returned to the patient in their original bottle.  Medication: Hydrocodone/APAP Pill/Patch Count: 11.5 of 45 pills remain Pill/Patch Appearance: Markings consistent with prescribed medication Bottle Appearance: Standard pharmacy container. Clearly labeled. Filled Date: 09/09/ 2020 Last Medication intake:  Today

## 2019-07-06 NOTE — Progress Notes (Signed)
Patient's Name: Dana Goodwin  MRN: [id number]  Referring Provider: Hunter, Denise, MD  DOB: 09/18/1961  PCP: Hunter, Denise, MD  DOS: 07/06/2019  Note by: Bilal Lateef, MD  Service setting: Ambulatory outpatient  Attending: Bilal Lateef, MD  Location: [facility] (AMB) Pain Management Facility  Specialty: Interventional Pain Management  Patient type: Established   Primary Reason(s) for Visit: Encounter for prescription drug management. (Level of risk: moderate)  CC: Back Pain (right lower)  HPI  Dana Goodwin is a 58 y.o. year old, female patient, who comes today for a medication management evaluation. She has Special screening for malignant neoplasms, colon; Lumbar disc herniation; History of lumbar surgery left L3-L4 laminotomy and microdiscectomy October 2018; Postherpetic neuralgia (shingles Oct 2019 Left L4/5 dermatome); Neuropathic pain; Post laminectomy syndrome; Chronic pain syndrome; Lumbar facet arthropathy; and Lumbar radiculopathy on their problem list. Her primarily concern today is the Back Pain (right lower)  Pain Assessment: Location: Right, Lower Back Radiating: right buttock to outside of right leg Onset: More than a month ago Duration: Chronic pain Quality: Stabbing Severity: 7 /10 (subjective, self-reported pain score)  Note: Reported level is compatible with observation.  Effect on ADL:   Timing: Constant Modifying factors: walking, medications BP: (!) 134/101  HR: 78  Dana Goodwin was last scheduled for an appointment on 06/01/2019 for medication management. During today's appointment we reviewed Dana Goodwin's chronic pain status, as well as her outpatient medication regimen.  Patient is having increased pain in her right axial low back radiating to her right hip her right buttock and her right posterior lateral thigh dermatomal distribution.  No inciting event.  No falls recently.  Patient states that the pain is resulting in significant disability in making it difficult for her to  perform ADLs.  Spent over 3 years since her previous lumbar MRI.  She does have a history of lumbar spine surgery.  Recommend repeat MRI.  Also recommend starting gabapentin as prescribed below.  We will also increase hydrocodone to 7.5 mg twice daily as needed given increased pain until we are able to understand its etiology.  Patient has tried lumbar epidural steroid injection and diagnostic lumbar facet medial branch nerve block which were not effective.  We did discuss thoracolumbar spinal cord stimulation and patient could be a candidate.  We will discuss with her further after I review her lumbar MRI.  The patient  reports no history of drug use. Her body mass index is 20.25 kg/m.  Further details on both, my assessment(s), as well as the proposed treatment plan, please see below.  Controlled Substance Pharmacotherapy Assessment REMS (Risk Evaluation and Mitigation Strategy)   06/14/2019  1   06/01/2019  Hydrocodone-Acetamin 7.5-325  45.00  30 Un Pha   [id number]   Nor (6917)   0  11.25 MME  Medicaid   Dana Goodwin      Wheatley, Dena L, RN  07/06/2019 12:00 PM  Sign when Signing Visit Nursing Pain Medication Assessment:  Safety precautions to be maintained throughout the outpatient stay will include: orient to surroundings, keep bed in low position, maintain call bell within reach at all times, provide assistance with transfer out of bed and ambulation.  Medication Inspection Compliance: Pill count conducted under aseptic conditions, in front of the patient. Neither the pills nor the bottle was removed from the patient's sight at any time. Once count was completed pills were immediately returned to the patient in their original bottle.  Medication: Hydrocodone/APAP Pill/Patch Count: 11.5 of 45 pills   remain Pill/Patch Appearance: Markings consistent with prescribed medication Bottle Appearance: Standard pharmacy container. Clearly labeled. Filled Date: 09/09/ 2020 Last Medication intake:   Today Pharmacokinetics: Liberation and absorption (onset of action): WNL Distribution (time to peak effect): WNL Metabolism and excretion (duration of action): WNL         Pharmacodynamics: Desired effects: Analgesia: Dana Goodwin reports <50% benefit. Functional ability: Patient reports that medication allows her to accomplish basic ADLs Clinically meaningful improvement in function (CMIF): Sustained CMIF goals met Perceived effectiveness: Described as relatively effective but with some room for improvement Undesirable effects: Side-effects or Adverse reactions: None reported Monitoring:  PMP: PDMP not reviewed this encounter. Online review of the past 12-month period conducted. Compliant with practice rules and regulations Last UDS on record: Summary  Date Value Ref Range Status  08/18/2018 FINAL  Final    Comment:    ==================================================================== TOX[facility]RE COMP DRUG ANALYSIS,UR ==================================================================== Test                             Result       Flag       Units Drug Present and Declared for Prescription Verification   Hydrocodone                    1106         EXPECTED   ng/mg creat   Hydromorphone                  361          EXPECTED   ng/mg creat   Dihydrocodeine                 166          EXPECTED   ng/mg creat   Norhydrocodone                 2189         EXPECTED   ng/mg creat    Sources of hydrocodone include scheduled prescription    medications. Hydromorphone, dihydrocodeine and norhydrocodone are    expected metabolites of hydrocodone. Hydromorphone and    dihydrocodeine are also available as scheduled prescription    medications.   Acetaminophen                  PRESENT      EXPECTED   Ibuprofen                      PRESENT      EXPECTED Drug Absent but Declared for Prescription Verification   Gabapentin                     Not Detected  UNEXPECTED ==================================================================== Test                      Result    Flag   Units      Ref Range   Creatinine              99               mg/dL      >=20 ==================================================================== Declared Medications:  The flagging and interpretation on this report are based on the  following declared medications.  Unexpected results may arise from  inaccuracies in the declared medications.  **Note: The testing scope of this panel includes these medications:  Gabapentin  Hydrocodone (  Hydrocodone-Acetaminophen)  **Note: The testing scope of this panel does not include small to  moderate amounts of these reported medications:  Acetaminophen  Acetaminophen (Hydrocodone-Acetaminophen)  Ibuprofen  **Note: The testing scope of this panel does not include following  reported medications:  Prednisone ==================================================================== For clinical consultation, please call [phone]. ====================================================================    UDS interpretation: Compliant          Medication Assessment Form: Reviewed. Patient indicates being compliant with therapy Treatment compliance: Compliant Risk Assessment Profile: Aberrant behavior: See initial evaluations. None observed or detected today Comorbid factors increasing risk of overdose: See initial evaluation. No additional risks detected today Opioid risk tool (ORT):  Opioid Risk  12/15/2018  Alcohol 0  Illegal Drugs 0  Rx Drugs 0  Alcohol 0  Illegal Drugs 0  Rx Drugs 0  Age between 16-45 years  0  History of Preadolescent Sexual Abuse 0  Psychological Disease 0  Depression 0  Opioid Risk Tool Scoring 0  Opioid Risk Interpretation Low Risk    ORT Scoring interpretation table:  Score <3 = Low Risk for SUD  Score between 4-7 = Moderate Risk for SUD  Score >8 = High Risk for Opioid Abuse   Risk of  substance use disorder (SUD): Low  Risk Mitigation Strategies:  Patient Counseling: Covered Patient-Prescriber Agreement (PPA): Present and active  Notification to other healthcare providers: Done  Pharmacologic Plan: No change in therapy, at this time.             Laboratory Chemistry Profile   Screening Lab Results  Component Value Date   STAPHAUREUS NEGATIVE 12/23/2016   MRSAPCR NEGATIVE 12/23/2016    Renal Lab Results  Component Value Date   BUN 13 10/20/2017   CREATININE 0.67 10/20/2017   GFRAA >60 10/20/2017   GFRNONAA >60 10/20/2017                             Hepatic No results found for: AST, ALT, ALBUMIN, ALKPHOS, HCVAB, AMYLASE, LIPASE, AMMONIA                      Electrolytes Lab Results  Component Value Date   NA 139 10/20/2017   K 4.1 10/20/2017   CL 103 10/20/2017   CALCIUM 9.7 10/20/2017                         Coagulation Lab Results  Component Value Date   PLT 239 10/20/2017                        Cardiovascular Lab Results  Component Value Date   HGB 14.1 10/20/2017   HCT 42.6 10/20/2017                         ID Lab Results  Component Value Date   STAPHAUREUS NEGATIVE 12/23/2016   MRSAPCR NEGATIVE 12/23/2016     Note: Lab results reviewed.    Meds   Current Outpatient Medications:  .  acetaminophen (TYLENOL) 500 MG tablet, Take 500 mg by mouth every 6 (six) hours as needed., Disp: , Rfl:  .  [START ON 07/12/2019] HYDROcodone-acetaminophen (NORCO) 7.5-325 MG tablet, Take 1-2 tablets by mouth daily as needed for severe pain. Must last 30 days., Disp: 60 tablet, Rfl: 0 .  [START ON 08/11/2019] HYDROcodone-acetaminophen (NORCO) 7.5-325 MG tablet, Take 1-2 tablets  by mouth daily as needed for severe pain. Must last 30 days., Disp: 60 tablet, Rfl: 0 .  ibuprofen (ADVIL,MOTRIN) 600 MG tablet, Take 1 tablet (600 mg total) by mouth every 6 (six) hours as needed., Disp: 30 tablet, Rfl: 0 .  gabapentin (NEURONTIN) 300 MG capsule, Take 1  capsule (300 mg total) by mouth 2 (two) times daily as needed., Disp: 60 capsule, Rfl: 2  ROS  Constitutional: Denies any fever or chills Gastrointestinal: No reported hemesis, hematochezia, vomiting, or acute GI distress Musculoskeletal: Denies any acute onset joint swelling, redness, loss of ROM, or weakness Neurological: No reported episodes of acute onset apraxia, aphasia, dysarthria, agnosia, amnesia, paralysis, loss of coordination, or loss of consciousness  CLINICAL DATA:  Low back pain with left leg pain. Previous back surgery.  EXAM: MRI LUMBAR SPINE WITHOUT CONTRAST  TECHNIQUE: Multiplanar, multisequence MR imaging of the lumbar spine was performed. No intravenous contrast was administered.  COMPARISON:  Lumbar radiographs 07/21/2016  FINDINGS: Segmentation: L5 is considered a transitional segment partially incorporated into the sacrum. Review of prior x-rays reveals small rib at T12 on the right.  Alignment:  Normal  Vertebrae:  Negative for fracture or mass.  Normal bone marrow.  Conus medullaris: Extends to the L1-2 level and appears normal.  Paraspinal and other soft tissues: Negative  Disc levels:  L1-2:  Negative  L2-3: Small left paracentral disc protrusion without significant stenosis or neural impingement. Neural foramina patent  L3-4: Moderate disc space narrowing. Diffuse disc bulging. Left paracentral disc protrusion with extruded disc fragment extending caudally. There is possible impingement of the left L4 nerve root in the subarticular zone. Mild spinal stenosis.  L4-5: Left laminectomy. Moderate disc degeneration with diffuse endplate spurring. Subarticular zone stenosis on the left due to spurring. 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 Possible impingement left L4 nerve root. Mild spinal stenosis  Left laminectomy L4-5. Subarticular zone stenosis on the left due to spurring.   Electronically Signed   By: Charles  Clark M.D.   On: 07/31/2016 10:47  Allergies  Dana Goodwin is allergic to no known allergies.  PFSH  Drug: Dana Goodwin  reports no history of drug use. Alcohol:  reports no history of alcohol use. Tobacco:  reports that she has been smoking. She has a 18.50 pack-year smoking history. She has never used smokeless tobacco. Medical:  has a past medical history of Chronic back pain, History of colon polyps, Numbness, Sciatica, and Urinary frequency. Surgical: Dana Goodwin  has a past surgical history that includes Colonoscopy (N/A, 06/19/2016); Back surgery; Fracture surgery (Left); and Lumbar laminectomy/decompression microdiscectomy (Left, 12/30/2016). Family: family history includes Diabetes in her brother; Heart attack in her brother and mother; Prostate cancer in her father.  Constitutional Exam  General appearance: Well nourished, well developed, and well hydrated. In no apparent acute distress Vitals:   07/06/19 1156  BP: (!) 134/101  Pulse: 78  Resp: 16  Temp: 98.9 F (37.2 C)  TempSrc: Oral  SpO2: 98%  Weight: 118 lb (53.5 kg)  Height: 5' 4" (1.626 m)   BMI Assessment: Estimated body mass index is 20.25 kg/m as calculated from the following:   Height as of this encounter: 5' 4" (1.626 m).   Weight as of this encounter: 118 lb (53.5   kg).  BMI interpretation table: BMI level Category Range association with higher incidence of chronic pain  <18 kg/m2 Underweight   18.5-24.9 kg/m2 Ideal body weight   25-29.9 kg/m2 Overweight Increased incidence by 20%  30-34.9 kg/m2 Obese (Class I) Increased incidence by 68%  35-39.9 kg/m2 Severe obesity (Class II) Increased incidence by 136%  >40 kg/m2 Extreme  obesity (Class III) Increased incidence by 254%   Patient's current BMI Ideal Body weight  Body mass index is 20.25 kg/m. Ideal body weight: 54.7 kg (120 lb 9.5 oz)   BMI Readings from Last 4 Encounters:  07/06/19 20.25 kg/m  12/15/18 20.25 kg/m  11/28/18 20.25 kg/m  11/17/18 20.25 kg/m   Wt Readings from Last 4 Encounters:  07/06/19 118 lb (53.5 kg)  12/15/18 118 lb (53.5 kg)  11/28/18 118 lb (53.5 kg)  11/17/18 118 lb (53.5 kg)  Psych/Mental status: Alert, oriented x 3 (person, place, & time)       Eyes: PERLA Respiratory: No evidence of acute respiratory distress  Cervical Spine Area Exam  Skin & Axial Inspection: No masses, redness, edema, swelling, or associated skin lesions Alignment: Symmetrical Functional ROM: Unrestricted ROM      Stability: No instability detected Muscle Tone/Strength: Functionally intact. No obvious neuro-muscular anomalies detected. Sensory (Neurological): Unimpaired Palpation: No palpable anomalies              Upper Extremity (UE) Exam    Side: Right upper extremity  Side: Left upper extremity  Skin & Extremity Inspection: Skin color, temperature, and hair growth are WNL. No peripheral edema or cyanosis. No masses, redness, swelling, asymmetry, or associated skin lesions. No contractures.  Skin & Extremity Inspection: Skin color, temperature, and hair growth are WNL. No peripheral edema or cyanosis. No masses, redness, swelling, asymmetry, or associated skin lesions. No contractures.  Functional ROM: Unrestricted ROM          Functional ROM: Unrestricted ROM          Muscle Tone/Strength: Functionally intact. No obvious neuro-muscular anomalies detected.  Muscle Tone/Strength: Functionally intact. No obvious neuro-muscular anomalies detected.  Sensory (Neurological): Unimpaired          Sensory (Neurological): Unimpaired          Palpation: No palpable anomalies              Palpation: No palpable anomalies              Provocative Test(s):   Phalen's test: deferred Tinel's test: deferred Apley's scratch test (touch opposite shoulder):  Action 1 (Across chest): deferred Action 2 (Overhead): deferred Action 3 (LB reach): deferred   Provocative Test(s):  Phalen's test: deferred Tinel's test: deferred Apley's scratch test (touch opposite shoulder):  Action 1 (Across chest): deferred Action 2 (Overhead): deferred Action 3 (LB reach): deferred    Thoracic Spine Area Exam  Skin & Axial Inspection: No masses, redness, or swelling Alignment: Symmetrical Functional ROM: Unrestricted ROM Stability: No instability detected Muscle Tone/Strength: Functionally intact. No obvious neuro-muscular anomalies detected. Sensory (Neurological): Unimpaired Muscle strength & Tone: No palpable anomalies  Lumbar Spine Area Exam  Skin & Axial Inspection: Well healed scar from previous spine surgery detected Alignment: Symmetrical Functional ROM: Decreased ROM affecting both sides Stability: No instability detected Muscle Tone/Strength: Functionally intact. No obvious neuro-muscular anomalies detected. Sensory (Neurological): Dermatomal pain pattern RIGHT >left Palpation: No palpable anomalies       Provocative Tests: Hyperextension/rotation test: (+) due to pain. Lumbar quadrant test (Kemp's test): (+) bilateral for foraminal  stenosis Lateral bending test: (+) due to pain. Patrick's Maneuver: deferred today                   FABER* test: deferred today                   S-I anterior distraction/compression test: deferred today         S-I lateral compression test: deferred today         S-I Thigh-thrust test: deferred today         S-I Gaenslen's test: deferred today         *(Flexion, ABduction and External Rotation)  Gait & Posture Assessment  Ambulation: Unassisted Gait: Relatively normal for age and body habitus Posture: WNL   Lower Extremity Exam    Side: Right lower extremity  Side: Left lower extremity  Stability: No  instability observed          Stability: No instability observed          Skin & Extremity Inspection: Skin color, temperature, and hair growth are WNL. No peripheral edema or cyanosis. No masses, redness, swelling, asymmetry, or associated skin lesions. No contractures.  Skin & Extremity Inspection: Skin color, temperature, and hair growth are WNL. No peripheral edema or cyanosis. No masses, redness, swelling, asymmetry, or associated skin lesions. No contractures.  Functional ROM: Decreased ROM for hip and knee joints          Functional ROM: Decreased ROM for hip and knee joints          Muscle Tone/Strength: Functionally intact. No obvious neuro-muscular anomalies detected.  Muscle Tone/Strength: Functionally intact. No obvious neuro-muscular anomalies detected.  Sensory (Neurological): Dermatomal pain pattern        Sensory (Neurological): Dermatomal pain pattern        DTR: Patellar: 1+: trace Achilles: 1+: trace Plantar: deferred today  DTR: Patellar: 1+: trace Achilles: 1+: trace Plantar: deferred today  Palpation: No palpable anomalies  Palpation: No palpable anomalies   Assessment   Status Diagnosis  Having a Flare-up Having a Flare-up Having a Flare-up 1. History of lumbar surgery left L3-L4 laminotomy and microdiscectomy October 2018   2. Post laminectomy syndrome   3. Lumbar radiculopathy   4. Chronic pain syndrome      Updated Problems: Problem  Lumbar Radiculopathy   Patient is having increased pain in her low back with radiation to her lower extremities.  Unlike previously were most of her pain with radiate down to her left posterior lateral thigh, she is now experiencing pain in her right buttock and her right posterior lateral thigh.  She describes this as a burning sensation.  Previous MRI was back in October 2017.  It is been almost 3 years.  Would like to repeat to evaluate for any worsening neuroforaminal or central canal stenosis or lumbar spinal nerve  impingement.  Pending MRI findings, can further discussed thoracolumbar spinal cord stimulator trial.  This was briefly discussed today with the patient.  In the interim, recommend patient start gabapentin as below.  We will temporarily increase hydrocodone to 7.5 mg twice daily PRN, quantity 60/month, previous quantity was 45.  Of note patient has done physical therapy in the past, greater than 2 and half years ago which she states was not helpful for her pain.   Plan of Care  Pharmacotherapy (Medications Ordered): Meds ordered this encounter  Medications  . gabapentin (NEURONTIN) 300 MG capsule    Sig: Take 1 capsule (300 mg   total) by mouth 2 (two) times daily as needed.    Dispense:  60 capsule    Refill:  2  . HYDROcodone-acetaminophen (NORCO) 7.5-325 MG tablet    Sig: Take 1-2 tablets by mouth daily as needed for severe pain. Must last 30 days.    Dispense:  60 tablet    Refill:  0    Martorell STOP ACT - Not applicable. Fill one day early if pharmacy is closed on scheduled refill date.  Marland Kitchen HYDROcodone-acetaminophen (NORCO) 7.5-325 MG tablet    Sig: Take 1-2 tablets by mouth daily as needed for severe pain. Must last 30 days.    Dispense:  60 tablet    Refill:  0    Kendall STOP ACT - Not applicable. Fill one day early if pharmacy is closed on scheduled refill date.   Orders:  Orders Placed This Encounter  Procedures  . MR LUMBAR SPINE WO CONTRAST    In addition to any acute findings, please report on degenerative changes related to: (Please specify level(s)) (1) ROM & instability (>68m displacement) (2) Facet joint (Zygoapophyseal Joint) (3) DDD and/or IVDD (4) Pars defects (5) Previous surgical changes (Include description of hardware and hardware status, if present) (6) Presence and degree of spondylolisthesis, spondylosis, and/or spondyloarthropathies)  (7) Old Fractures (8) Demineralization (9) Additional bone pathology (10) Stenosis (Central, Lateral Recess, Foraminal) (11) If  at all possible, please provide AP diameter (mm) of foraminal and/or central canal.    Standing Status:   Future    Standing Expiration Date:   10/06/2019    Order Specific Question:   What is the patient's sedation requirement?    Answer:   No Sedation    Order Specific Question:   Does the patient have a pacemaker or implanted devices?    Answer:   No    Order Specific Question:   Preferred imaging location?    Answer:   [facility] (table limit-350lbs)    Order Specific Question:   Call Results- Best Contact Number?    Answer:   [phone]([facility]    Order Specific Question:   Radiology Contrast Protocol - do NOT remove file path    Answer:   _0 [email or web address]  . ToxASSURE Select 13 (MW), Urine    Volume: 30 ml(s). Minimum 3 ml of urine is needed. Document temperature of fresh sample. Indications: Long term (current) use of opiate analgesic (Z79.891)    Lab Orders     ToxASSURE Select 13 (MW), Urine  Imaging Orders     MR LUMBAR SPINE WO CONTRAST Planned follow-up:   Return in about 8 weeks (around 08/31/2019) for Medication Management, in person.    Recent Visits Date Type Provider Dept  06/01/19 Office Visit LGillis Santa MD [facility]  Showing recent visits within past 90 days and meeting all other requirements   Today's Visits Date Type Provider Dept  07/06/19 Office Visit LGillis Santa MD [facility]  Showing today's visits and meeting all other requirements   Future Appointments Date Type Provider Dept  08/28/19 Appointment LGillis Santa MD [facility]  Showing future appointments within next 90 days and meeting all other requirements   Primary Care Physician: HAbran Richard MD [facility] Note by: BGillis Santa MD Date: 07/06/2019; Time: 1:09 PM  Note: This dictation was prepared with Dragon dictation. Any transcriptional errors that may  result from this process are unintentional.

## 2019-07-06 NOTE — Telephone Encounter (Signed)
[facility] [employer] was called. Talked to Lytle Michaels to cancel prior prescription for hydrocodone/Acetaminophen for 45 pills. New order was received for hydrocodone/Acetaminophen for 60 pills.

## 2019-07-06 NOTE — Patient Instructions (Signed)
Please call pharmacy and cancel Hydrocodone Rx with quantity 45, New Rx called with with #60

## 2019-07-09 LAB — TOXASSURE SELECT 13 (MW), URINE

## 2019-08-28 ENCOUNTER — Ambulatory Visit
Payer: Medicaid Other | Attending: Student in an Organized Health Care Education/Training Program | Admitting: Student in an Organized Health Care Education/Training Program

## 2019-08-28 ENCOUNTER — Other Ambulatory Visit: Payer: Self-pay

## 2019-08-28 ENCOUNTER — Encounter: Payer: Self-pay | Admitting: Student in an Organized Health Care Education/Training Program

## 2019-08-28 VITALS — BP 134/80 | HR 68 | Temp 98.4°F | Resp 18 | Ht 64.0 in | Wt 118.0 lb

## 2019-08-28 DIAGNOSIS — M961 Postlaminectomy syndrome, not elsewhere classified: Secondary | ICD-10-CM | POA: Diagnosis not present

## 2019-08-28 DIAGNOSIS — Z9889 Other specified postprocedural states: Secondary | ICD-10-CM

## 2019-08-28 DIAGNOSIS — B0229 Other postherpetic nervous system involvement: Secondary | ICD-10-CM

## 2019-08-28 DIAGNOSIS — G894 Chronic pain syndrome: Secondary | ICD-10-CM

## 2019-08-28 DIAGNOSIS — M5416 Radiculopathy, lumbar region: Secondary | ICD-10-CM | POA: Diagnosis not present

## 2019-08-28 DIAGNOSIS — M47816 Spondylosis without myelopathy or radiculopathy, lumbar region: Secondary | ICD-10-CM

## 2019-08-28 MED ORDER — TIZANIDINE HCL 2 MG PO TABS: 1.0000 mg | ORAL_TABLET | Freq: Every day | ORAL | 1 refills | Status: DC

## 2019-08-28 MED ORDER — HYDROCODONE-ACETAMINOPHEN 7.5-325 MG PO TABS: 1.0000 | ORAL_TABLET | Freq: Two times a day (BID) | ORAL | 0 refills | Status: DC | PRN

## 2019-08-28 NOTE — Patient Instructions (Signed)
I will call you with MRI results Medication management follow up in 2 months (temporary increase in Hydrocodone until we get MRI and develop treatment plan)

## 2019-08-28 NOTE — Progress Notes (Signed)
  Nursing Pain Medication Assessment:  Safety precautions to be maintained throughout the outpatient stay will include: orient to surroundings, keep bed in low position, maintain call bell within reach at all times, provide assistance with transfer out of bed and ambulation.  Medication Inspection Compliance: Pill count conducted under aseptic conditions, in front of the patient. Neither the pills nor the bottle was removed from the patient's sight at any time. Once count was completed pills were immediately returned to the patient in their original bottle.  Medication: Hydrocodone/APAP Pill/Patch Count: 24.5 of 60 pills remain Pill/Patch Appearance: Markings consistent with prescribed medication Bottle Appearance: Standard pharmacy container. Clearly labeled. Filled Date: 08-11-2019  Last Medication intake:  Today

## 2019-08-28 NOTE — Progress Notes (Signed)
Patient's Name: Dana Goodwin  MRN: [id number]  Referring Provider: Abran Richard, MD  DOB: 24-Nov-1960  PCP: Abran Richard, MD  DOS: 08/28/2019  Note by: Gillis Santa, MD  Service setting: Ambulatory outpatient  Attending: Gillis Santa, MD  Location: [facility] (AMB) Pain Management Facility  Specialty: Interventional Pain Management  Patient type: Established   Primary Reason(s) for Visit: Encounter for prescription drug management. (Level of risk: moderate)  CC: Back Pain (right)  HPI  Ms. Bewick is a 58 y.o. year old, female patient, who comes today for a medication management evaluation. She has Special screening for malignant neoplasms, colon; Lumbar disc herniation; History of lumbar surgery left L3-L4 laminotomy and microdiscectomy October 2018; Postherpetic neuralgia (shingles Oct 2019 Left L4/5 dermatome); Neuropathic pain; Post laminectomy syndrome; Chronic pain syndrome; Lumbar facet arthropathy; and Lumbar radiculopathy on their problem list. Her primarily concern today is the Back Pain (right)  Pain Assessment: Location: Lower, Right Back Radiating: radiates from right buttock around to right groin and down front of right thigh to ankle Onset: More than a month ago Duration: Chronic pain Quality: Stabbing Severity: 8 /10 (subjective, self-reported pain score)  Note: Reported level is inconsistent with clinical observations.                         When using our objective Pain Scale, levels between 6 and 10/10 are said to belong in an emergency room, as it progressively worsens from a 6/10, described as severely limiting, requiring emergency care not usually available at an outpatient pain management facility. At a 6/10 level, communication becomes difficult and requires great effort. Assistance to reach the emergency department may be required. Facial flushing and profuse sweating along with potentially dangerous increases in heart rate and blood pressure will be evident. Effect on ADL:  "I have to lie down all the time" Timing: Constant Modifying factors: medications BP: 134/80  HR: 68  Ms. Krupinski was last scheduled for an appointment on 07/06/2019 for medication management. During today's appointment we reviewed Ms. Donati's chronic pain status, as well as her outpatient medication regimen.  Patient has not completed her MRI.  Pain clinic staff will try and expedite this.  Ms. Rommel continues to endorse worsening pain in her low back that radiates into her right lateral thigh, right buttock and right groin.  She states that the pain is getting worse.  She describes it as a very sharp and knifelike pain in her low back.  Of note patient has tried diagnostic lumbar epidural steroid injection as well as lumbar facet medial branch nerve blocks which were not effective for her radicular pain.  In regards to medication management, patient endorses sedation with gabapentin at 300 mg twice a day.  I encouraged the patient to decrease her dose to 300 mg nightly.  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  Until the patient is able to complete her MRI and we developed a treatment plan, temporary increase in hydrocodone from quantity 60-->75 as below.  We will also have patient start tizanidine nightly as needed and see if that helps out with her muscle cramps and spasms.  Cautioned patient to not take this medication within 90 minutes of gabapentin.  The patient  reports no history of drug use. Her body mass index is 20.25 kg/m.  Further details on both, my assessment(s),  as well as the proposed treatment plan, please see below.  Controlled Substance Pharmacotherapy Assessment REMS (Risk Evaluation and Mitigation Strategy)  Analgesic:  08/11/2019  1   07/06/2019  Hydrocodone-Acetamin 7.5-325  60.00  30 Un Pha   [id number]   Nor ([id number])   0  15.00 MME  Private Pay   [place]      Dewayne Shorter, RN  08/28/2019  1:16 PM  Signed  Nursing Pain Medication Assessment:  Safety precautions to be maintained throughout the outpatient stay will include: orient to surroundings, keep bed in low position, maintain call bell within reach at all times, provide assistance with transfer out of bed and ambulation.  Medication Inspection Compliance: Pill count conducted under aseptic conditions, in front of the patient. Neither the pills nor the bottle was removed from the patient's sight at any time. Once count was completed pills were immediately returned to the patient in their original bottle.  Medication: Hydrocodone/APAP Pill/Patch Count: 24.5 of 60 pills remain Pill/Patch Appearance: Markings consistent with prescribed medication Bottle Appearance: Standard pharmacy container. Clearly labeled. Filled Date: 08-11-2019  Last Medication intake:  Today   Pharmacokinetics: Liberation and absorption (onset of action): WNL Distribution (time to peak effect): WNL Metabolism and excretion (duration of action): WNL         Pharmacodynamics: Desired effects: Analgesia: Ms. Kallstrom reports <50% benefit. Functional ability: Patient reports that medication does help, but not nearly as much as she would like Clinically meaningful improvement in function (CMIF): Sustained CMIF goals met Perceived effectiveness: Described as relatively effective but with some room for improvement Undesirable effects: Side-effects or Adverse reactions: None reported Monitoring: [place] PMP: PDMP not reviewed this encounter. Online review of the past 74-monthperiod conducted. Compliant with practice rules and regulations Last UDS on record: Summary  Date Value Ref Range Status  07/06/2019 Note  Final    Comment:    ==================================================================== [employer] 13 (MW) ==================================================================== Test                              Result       Flag       Units Drug Present and Declared for Prescription Verification   Hydrocodone                    1267         EXPECTED   ng/mg creat   Hydromorphone                  505          EXPECTED   ng/mg creat   Norhydrocodone                 2314         EXPECTED   ng/mg creat    Sources of hydrocodone include scheduled prescription medications.    Hydromorphone and norhydrocodone are expected metabolites of    hydrocodone. Hydromorphone is also available as a scheduled    prescription medication. ==================================================================== Test                      Result    Flag   Units      Ref Range   Creatinine              21               mg/dL      >=20 ====================================================================  Declared Medications:  The flagging and interpretation on this report are based on the  following declared medications.  Unexpected results may arise from  inaccuracies in the declared medications.  **Note: The testing scope of this panel includes these medications:  Hydrocodone  **Note: The testing scope of this panel does not include the  following reported medications:  Acetaminophen  Gabapentin  Ibuprofen ==================================================================== For clinical consultation, please call [phone]. ====================================================================    UDS interpretation: Compliant          Medication Assessment Form: Reviewed. Patient indicates being compliant with therapy Treatment compliance: Compliant Risk Assessment Profile: Aberrant behavior: See initial evaluations. None observed or detected today Comorbid factors increasing risk of overdose: See initial evaluation. No additional risks detected today Opioid risk tool (ORT):  Opioid Risk  08/28/2019  Alcohol 0  Illegal Drugs 0  Rx Drugs 0  Alcohol 0  Illegal Drugs 0  Rx Drugs 0  Age between 16-45  years  0  History of Preadolescent Sexual Abuse 0  Psychological Disease 0  Depression 0  Opioid Risk Tool Scoring 0  Opioid Risk Interpretation Low Risk    ORT Scoring interpretation table:  Score <3 = Low Risk for SUD  Score between 4-7 = Moderate Risk for SUD  Score >8 = High Risk for Opioid Abuse   Risk of substance use disorder (SUD): Low  Risk Mitigation Strategies:  Patient Counseling: Covered Patient-Prescriber Agreement (PPA): Present and active  Notification to other healthcare providers: Done  Pharmacologic Plan: Increase monthly quantity of hydrocodone for #60 to  #75 until we are able to obtain lumbar MRI and develop treatment plan.             Laboratory Chemistry Profile   Screening Lab Results  Component Value Date   STAPHAUREUS NEGATIVE 12/23/2016   MRSAPCR NEGATIVE 12/23/2016    Inflammation (CRP: Acute Phase) (ESR: Chronic Phase) No results found for: CRP, ESRSEDRATE, LATICACIDVEN                       Rheumatology No results found for: RF, ANA, LABURIC, URICUR, LYMEIGGIGMAB, LYMEABIGMQN, HLAB27                      Renal Lab Results  Component Value Date   BUN 13 10/20/2017   CREATININE 0.67 10/20/2017   GFRAA >60 10/20/2017   GFRNONAA >60 10/20/2017                             Hepatic No results found for: AST, ALT, ALBUMIN, ALKPHOS, HCVAB, AMYLASE, LIPASE, AMMONIA                      Electrolytes Lab Results  Component Value Date   NA 139 10/20/2017   K 4.1 10/20/2017   CL 103 10/20/2017   CALCIUM 9.7 10/20/2017                        Neuropathy No results found for: VITAMINB12, FOLATE, HGBA1C, HIV                      CNS No results found for: COLORCSF, APPEARCSF, RBCCOUNTCSF, WBCCSF, POLYSCSF, LYMPHSCSF, EOSCSF, PROTEINCSF, GLUCCSF, JCVIRUS, CSFOLI, IGGCSF, LABACHR, ACETBL                      Bone No results  found for: Marveen Reeks, ER1540GQ6, PY1950DT2, 25OHVITD1, 25OHVITD2, 25OHVITD3, TESTOFREE, TESTOSTERONE                        Coagulation Lab Results  Component Value Date   PLT 239 10/20/2017                        Cardiovascular Lab Results  Component Value Date   HGB 14.1 10/20/2017   HCT 42.6 10/20/2017                         ID Lab Results  Component Value Date   STAPHAUREUS NEGATIVE 12/23/2016   MRSAPCR NEGATIVE 12/23/2016    Cancer No results found for: CEA, CA125, LABCA2                      Endocrine No results found for: TSH, FREET4, TESTOFREE, TESTOSTERONE, SHBG, ESTRADIOL, ESTRADIOLPCT, ESTRADIOLFRE, LABPREG, ACTH                      Note: Lab results reviewed.  Recent Diagnostic Imaging Results  DG C-Arm 1-60 Min-No Report Fluoroscopy was utilized by the requesting physician.  No radiographic  interpretation.   Complexity Note: Imaging results reviewed. Results shared with Ms. Warburton, using Layman's terms.                               Meds   Current Outpatient Medications:  .  acetaminophen (TYLENOL) 500 MG tablet, Take 500 mg by mouth every 6 (six) hours as needed., Disp: , Rfl:  .  gabapentin (NEURONTIN) 300 MG capsule, Take 1 capsule (300 mg total) by mouth 2 (two) times daily as needed., Disp: 60 capsule, Rfl: 2 .  [START ON 09/04/2019] HYDROcodone-acetaminophen (NORCO) 7.5-325 MG tablet, Take 1-2 tablets by mouth 2 (two) times daily as needed for severe pain. Must last 30 days. Max 75/month, Disp: 75 tablet, Rfl: 0 .  [START ON 10/04/2019] HYDROcodone-acetaminophen (NORCO) 7.5-325 MG tablet, Take 1-2 tablets by mouth 2 (two) times daily as needed for severe pain. Must last 30 days. Max 75/month, Disp: 75 tablet, Rfl: 0 .  ibuprofen (ADVIL,MOTRIN) 600 MG tablet, Take 1 tablet (600 mg total) by mouth every 6 (six) hours as needed., Disp: 30 tablet, Rfl: 0 .  tiZANidine (ZANAFLEX) 2 MG tablet, Take 0.5-1 tablets (1-2 mg total) by mouth at bedtime., Disp: 30 tablet, Rfl: 1  ROS  Constitutional: Denies any fever or chills Gastrointestinal: No reported hemesis,  hematochezia, vomiting, or acute GI distress Musculoskeletal: Denies any acute onset joint swelling, redness, loss of ROM, or weakness Neurological: No reported episodes of acute onset apraxia, aphasia, dysarthria, agnosia, amnesia, paralysis, loss of coordination, or loss of consciousness  Allergies  Ms. Abrigo is allergic to no known allergies.  [place]  Drug: Ms. Boody  reports no history of drug use. Alcohol:  reports no history of alcohol use. Tobacco:  reports that she has been smoking. She has a 18.50 pack-year smoking history. She has never used smokeless tobacco. Medical:  has a past medical history of Chronic back pain, History of colon polyps, Numbness, Sciatica, and Urinary frequency. Surgical: Ms. Limberg  has a past surgical history that includes Colonoscopy (N/A, 06/19/2016); Back surgery; Fracture surgery (Left); and Lumbar laminectomy/decompression microdiscectomy (Left, 12/30/2016). Family: family history includes Diabetes in her brother; Heart  attack in her brother and mother; Prostate cancer in her father.  Constitutional Exam  General appearance: Well nourished, well developed, and well hydrated. In no apparent acute distress Vitals:   08/28/19 1305  BP: 134/80  Pulse: 68  Resp: 18  Temp: 98.4 F (36.9 C)  SpO2: 100%  Weight: 118 lb (53.5 kg)  Height: 5' 4"  (1.626 m)   BMI Assessment: Estimated body mass index is 20.25 kg/m as calculated from the following:   Height as of this encounter: 5' 4"  (1.626 m).   Weight as of this encounter: 118 lb (53.5 kg).  BMI interpretation table: BMI level Category Range association with higher incidence of chronic pain  <18 kg/m2 Underweight   18.5-24.9 kg/m2 Ideal body weight   25-29.9 kg/m2 Overweight Increased incidence by 20%  30-34.9 kg/m2 Obese (Class I) Increased incidence by 68%  35-39.9 kg/m2 Severe obesity (Class II) Increased incidence by 136%  >40 kg/m2 Extreme obesity (Class III) Increased incidence by 254%    Patient's current BMI Ideal Body weight  Body mass index is 20.25 kg/m. Ideal body weight: 54.7 kg (120 lb 9.5 oz)   BMI Readings from Last 4 Encounters:  08/28/19 20.25 kg/m  07/06/19 20.25 kg/m  12/15/18 20.25 kg/m  11/28/18 20.25 kg/m   Wt Readings from Last 4 Encounters:  08/28/19 118 lb (53.5 kg)  07/06/19 118 lb (53.5 kg)  12/15/18 118 lb (53.5 kg)  11/28/18 118 lb (53.5 kg)  Psych/Mental status: Alert, oriented x 3 (person, place, & time)       Eyes: PERLA Respiratory: No evidence of acute respiratory distress  Cervical Spine Area Exam  Skin & Axial Inspection: No masses, redness, edema, swelling, or associated skin lesions Alignment: Symmetrical Functional ROM: Unrestricted ROM      Stability: No instability detected Muscle Tone/Strength: Functionally intact. No obvious neuro-muscular anomalies detected. Sensory (Neurological): Unimpaired Palpation: No palpable anomalies              Upper Extremity (UE) Exam    Side: Right upper extremity  Side: Left upper extremity  Skin & Extremity Inspection: Skin color, temperature, and hair growth are WNL. No peripheral edema or cyanosis. No masses, redness, swelling, asymmetry, or associated skin lesions. No contractures.  Skin & Extremity Inspection: Skin color, temperature, and hair growth are WNL. No peripheral edema or cyanosis. No masses, redness, swelling, asymmetry, or associated skin lesions. No contractures.  Functional ROM: Unrestricted ROM          Functional ROM: Unrestricted ROM          Muscle Tone/Strength: Functionally intact. No obvious neuro-muscular anomalies detected.  Muscle Tone/Strength: Functionally intact. No obvious neuro-muscular anomalies detected.  Sensory (Neurological): Unimpaired          Sensory (Neurological): Unimpaired          Palpation: No palpable anomalies              Palpation: No palpable anomalies              Provocative Test(s):  Phalen's test: deferred Tinel's test:  deferred Apley's scratch test (touch opposite shoulder):  Action 1 (Across chest): deferred Action 2 (Overhead): deferred Action 3 (LB reach): deferred   Provocative Test(s):  Phalen's test: deferred Tinel's test: deferred Apley's scratch test (touch opposite shoulder):  Action 1 (Across chest): deferred Action 2 (Overhead): deferred Action 3 (LB reach): deferred    Thoracic Spine Area Exam  Skin & Axial Inspection: No masses, redness, or swelling Alignment: Symmetrical  Functional ROM: Unrestricted ROM Stability: No instability detected Muscle Tone/Strength: Functionally intact. No obvious neuro-muscular anomalies detected. Sensory (Neurological): Unimpaired Muscle strength & Tone: No palpable anomalies  Lumbar Spine Area Exam  Skin & Axial Inspection: Well healed scar from previous spine surgery detected Alignment: Symmetrical Functional ROM: Decreased ROM affecting both sides Stability: No instability detected Muscle Tone/Strength: Functionally intact. No obvious neuro-muscular anomalies detected. Sensory (Neurological): Dermatomal pain pattern Palpation: No palpable anomalies       Provocative Tests: Hyperextension/rotation test: deferred today       Lumbar quadrant test (Kemp's test): (+) on the right for foraminal stenosis Lateral bending test: (+) ipsilateral radicular pain, on the right. Positive for right-sided foraminal stenosis. Patrick's Maneuver: deferred today                   FABER* test: deferred today                   S-I anterior distraction/compression test: deferred today         S-I lateral compression test: deferred today         S-I Thigh-thrust test: deferred today         S-I Gaenslen's test: deferred today         *(Flexion, ABduction and External Rotation)  Gait & Posture Assessment  Ambulation: Unassisted Gait: Relatively normal for age and body habitus Posture: WNL   Lower Extremity Exam    Side: Right lower extremity  Side: Left lower  extremity  Stability: No instability observed          Stability: No instability observed          Skin & Extremity Inspection: Skin color, temperature, and hair growth are WNL. No peripheral edema or cyanosis. No masses, redness, swelling, asymmetry, or associated skin lesions. No contractures.  Skin & Extremity Inspection: Skin color, temperature, and hair growth are WNL. No peripheral edema or cyanosis. No masses, redness, swelling, asymmetry, or associated skin lesions. No contractures.  Functional ROM: Pain restricted ROM for all joints of the lower extremity          Functional ROM: Unrestricted ROM                  Muscle Tone/Strength: Functionally intact. No obvious neuro-muscular anomalies detected.  Muscle Tone/Strength: Functionally intact. No obvious neuro-muscular anomalies detected.  Sensory (Neurological): Dermatomal pain pattern        Sensory (Neurological): Unimpaired        DTR: Patellar: deferred today Achilles: deferred today Plantar: deferred today  DTR: Patellar: deferred today Achilles: deferred today Plantar: deferred today  Palpation: No palpable anomalies  Palpation: No palpable anomalies   Assessment   Status Diagnosis  Persistent Persistent Persistent 1. History of lumbar surgery left L3-L4 laminotomy and microdiscectomy October 2018   2. Chronic pain syndrome   3. Post laminectomy syndrome   4. Lumbar radiculopathy   5. Lumbar facet arthropathy   6. Postherpetic neuralgia (shingles Oct 2019 Left L4/5 dermatome)   7. Lumbar spondylosis       1.  Will call patient after she has completed her lumbar MRI to discuss results and treatment plan.  This could include referral to neurosurgery and or discussion of thoracolumbar spinal cord stimulation. 2.  Temporary increase in hydrocodone from quantity 60 to 75 given increased pain.  Titration of gabapentin resulted in sedation and drowsiness so I encouraged her to decrease her dose back to 300 mg nightly.  Has  tried Lyrica in the past was not effective. 3.  Trial of tizanidine as below, as needed nightly.   Plan of Care  Pharmacotherapy (Medications Ordered): Meds ordered this encounter  Medications  . HYDROcodone-acetaminophen (NORCO) 7.5-325 MG tablet    Sig: Take 1-2 tablets by mouth 2 (two) times daily as needed for severe pain. Must last 30 days. Max 75/month    Dispense:  75 tablet    Refill:  0    [place] STOP ACT - Not applicable. Fill one day early if pharmacy is closed on scheduled refill date.  Marland Kitchen HYDROcodone-acetaminophen (NORCO) 7.5-325 MG tablet    Sig: Take 1-2 tablets by mouth 2 (two) times daily as needed for severe pain. Must last 30 days. Max 75/month    Dispense:  75 tablet    Refill:  0    [place] STOP ACT - Not applicable. Fill one day early if pharmacy is closed on scheduled refill date.  Marland Kitchen tiZANidine (ZANAFLEX) 2 MG tablet    Sig: Take 0.5-1 tablets (1-2 mg total) by mouth at bedtime.    Dispense:  30 tablet    Refill:  1    Do not place this medication, or any other prescription from our practice, on "Automatic Refill". Patient may have prescription filled one day early if pharmacy is closed on scheduled refill date.    Planned follow-up:   Return in about 9 weeks (around 10/30/2019) for Medication Management, virtual.    Recent Visits Date Type Provider Dept  07/06/19 Office Visit Gillis Santa, MD [facility]  06/01/19 Office Visit Gillis Santa, MD [facility]  Showing recent visits within past 90 days and meeting all other requirements   Today's Visits Date Type Provider Dept  08/28/19 Office Visit Gillis Santa, MD [facility]  Showing today's visits and meeting all other requirements   Future Appointments Date Type Provider Dept  10/24/19 Appointment Gillis Santa, MD [facility]  Showing future appointments within next 90 days and meeting all other requirements   Primary Care Physician: Abran Richard, MD Location:  [facility] Note by: Gillis Santa, MD Date: 08/28/2019; Time: 1:58 PM  Note: This dictation was prepared with Dragon dictation. Any transcriptional errors that may result from this process are unintentional.

## 2019-10-04 ENCOUNTER — Other Ambulatory Visit: Payer: Self-pay | Admitting: Student in an Organized Health Care Education/Training Program

## 2019-10-04 DIAGNOSIS — G894 Chronic pain syndrome: Secondary | ICD-10-CM

## 2019-10-10 ENCOUNTER — Other Ambulatory Visit (HOSPITAL_COMMUNITY): Payer: Self-pay | Admitting: Internal Medicine

## 2019-10-10 DIAGNOSIS — Z1231 Encounter for screening mammogram for malignant neoplasm of breast: Secondary | ICD-10-CM

## 2019-10-12 ENCOUNTER — Other Ambulatory Visit: Payer: Self-pay

## 2019-10-12 ENCOUNTER — Ambulatory Visit (HOSPITAL_COMMUNITY)
Admission: RE | Admit: 2019-10-12 | Discharge: 2019-10-12 | Disposition: A | Discharge status: Home / Self Care | Payer: Medicaid Other | Source: Ambulatory Visit | Attending: Internal Medicine | Admitting: Internal Medicine

## 2019-10-12 DIAGNOSIS — Z1231 Encounter for screening mammogram for malignant neoplasm of breast: Secondary | ICD-10-CM | POA: Insufficient documentation

## 2019-10-23 ENCOUNTER — Encounter: Payer: Self-pay | Admitting: Student in an Organized Health Care Education/Training Program

## 2019-10-24 ENCOUNTER — Other Ambulatory Visit: Payer: Self-pay

## 2019-10-24 ENCOUNTER — Telehealth: Payer: Self-pay

## 2019-10-24 ENCOUNTER — Encounter: Payer: Self-pay | Admitting: Student in an Organized Health Care Education/Training Program

## 2019-10-24 ENCOUNTER — Ambulatory Visit
Payer: Medicaid Other | Attending: Student in an Organized Health Care Education/Training Program | Admitting: Student in an Organized Health Care Education/Training Program

## 2019-10-24 DIAGNOSIS — M961 Postlaminectomy syndrome, not elsewhere classified: Secondary | ICD-10-CM

## 2019-10-24 DIAGNOSIS — Z9889 Other specified postprocedural states: Secondary | ICD-10-CM

## 2019-10-24 DIAGNOSIS — M792 Neuralgia and neuritis, unspecified: Secondary | ICD-10-CM

## 2019-10-24 DIAGNOSIS — G894 Chronic pain syndrome: Secondary | ICD-10-CM

## 2019-10-24 DIAGNOSIS — M5416 Radiculopathy, lumbar region: Secondary | ICD-10-CM

## 2019-10-24 DIAGNOSIS — B0229 Other postherpetic nervous system involvement: Secondary | ICD-10-CM | POA: Diagnosis not present

## 2019-10-24 MED ORDER — TIZANIDINE HCL 2 MG PO TABS: 1.0000 mg | ORAL_TABLET | Freq: Every day | ORAL | 1 refills | Status: AC

## 2019-10-24 MED ORDER — HYDROCODONE-ACETAMINOPHEN 7.5-325 MG PO TABS: 1.0000 | ORAL_TABLET | Freq: Two times a day (BID) | ORAL | 0 refills | Status: DC | PRN

## 2019-10-24 MED ORDER — DULOXETINE HCL 20 MG PO CPEP: 20.0000 mg | ORAL_CAPSULE | Freq: Every day | ORAL | 1 refills | Status: DC

## 2019-10-24 NOTE — Progress Notes (Signed)
Patient: Dana Goodwin  Service Category: E/M  Provider: Gillis Santa, MD  DOB: 07/20/1961  DOS: 10/24/2019  Location: Office  MRN: [id number]  Setting: Ambulatory outpatient  Referring Provider: Abran Richard, MD  Type: Established Patient  Specialty: Interventional Pain Management  PCP: Abran Richard, MD  Location: Home  Delivery: TeleHealth     Virtual Encounter - Pain Management PROVIDER NOTE: Information contained herein reflects review and annotations entered in association with encounter. Interpretation of such information and data should be left to medically-trained personnel. Information provided to patient can be located elsewhere in the medical record under "Patient Instructions". Document created using STT-dictation technology, any transcriptional errors that may result from process are unintentional.    Contact & Pharmacy Preferred: [phone] Home: [phone] (home) Mobile: [phone] (mobile) E-mail: [email or web address]  [place], [place] - [address] Home [place] [address] Phone: [phone] Fax: [phone]   Pre-screening  Dana Goodwin offered "in-person" vs "virtual" encounter. She indicated preferring virtual for this encounter.   Reason COVID-19*  Social distancing based on CDC and AMA recommendations.   I contacted Dana Goodwin on 10/24/2019 via telephone.      I clearly identified myself as Gillis Santa, MD. I verified that I was speaking with the correct person using two identifiers (Name: Dana Goodwin, and date of birth: September 17, 1961).  This visit was completed via telephone due to the restrictions of the COVID-19 pandemic. All issues as above were discussed and addressed but no physical exam was performed. If it was felt that the patient should be evaluated in the office, they were directed there. The patient verbally consented to this visit. Patient was unable to complete an audio/visual visit due to Technical  difficulties and/or Lack of internet. Due to the catastrophic nature of the COVID-19 pandemic, this visit was done through audio contact only.  Location of the patient: home address (see Epic for details)  Location of the provider: office Consent I sought verbal advanced consent from Dana Goodwin for virtual visit interactions. I informed Dana Goodwin of possible security and privacy concerns, risks, and limitations associated with providing "not-in-person" medical evaluation and management services. I also informed Dana Goodwin of the availability of "in-person" appointments. Finally, I informed her that there would be a charge for the virtual visit and that she could be  personally, fully or partially, financially responsible for it. Dana Goodwin expressed understanding and agreed to proceed.   Historic Elements   Dana Goodwin is a 59 y.o. year old, female patient evaluated today after her last encounter by our practice on 10/04/2019. Dana Goodwin  has a past medical history of Chronic back pain, History of colon polyps, Numbness, Sciatica, and Urinary frequency. She also  has a past surgical history that includes Colonoscopy (N/A, 06/19/2016); Back surgery; Fracture surgery (Left); and Lumbar laminectomy/decompression microdiscectomy (Left, 12/30/2016). Dana Goodwin has a current medication list which includes the following prescription(s): acetaminophen, gabapentin, ibuprofen, duloxetine, [START ON 11/03/2019] hydrocodone-acetaminophen, [START ON 12/03/2019] hydrocodone-acetaminophen, and tizanidine. She  reports that she has been smoking. She has a 18.50 pack-year smoking history. She has never used smokeless tobacco. She reports that she does not drink alcohol or use drugs. Dana Goodwin is allergic to no known allergies.   HPI  Today, she is being contacted for medication management.    Complaining of new onset radicular pain in RIGHT leg.  Radiates down to her right buttock, right groin region  History  of  lumbar spine surgery at L4-L5 on the left, left L4-L5 laminectomy along with left L3/4 lumbar microdiscectomy performed in 2018.Marland Kitchen  Patient states that she usually only has pain along her left side for which we have done a left L3-L4 epidural steroid injection as well as lumbar facet blocks.  Patient states that the new pain radiating down her right leg and into her right groin is more worrisome for her.  She does find gabapentin helpful although it makes her very sedated and she can only take it at night.  We discussed reducing her dose to 100 mg nightly but patient states that she also notices the same thing at that dose.  I encouraged her to take the medication at around 8 or 9 PM rather than 10 PM when she was taking it.  Discussed adding Cymbalta low-dose which could work synergistically with gabapentin.  Patient has tried and failed Lyrica in the past.  Also recommend lumbar MRI for her radicular pain in the context of L4-L5 laminectomy and new onset pain radiating down her right leg.  This has been unresponsive to epidural steroid injection and physical therapy.   Pharmacotherapy Assessment  Analgesic:  10/04/2019  1   08/28/2019  Hydrocodone-Acetamin 7.5-325  75.00  30 Un Pha   [id number]   Nor (6917)   0  18.75 MME  Medicaid   [place]    Monitoring: Pharmacotherapy: No side-effects or adverse reactions reported. [place] PMP: PDMP reviewed during this encounter.       Compliance: No problems identified. Effectiveness: Clinically acceptable. Plan: Refer to "POC".  UDS:  Summary  Date Value Ref Range Status  07/06/2019 Note  Final    Comment:    ==================================================================== [facility] Select 13 (MW) ==================================================================== Test                             Result       Flag       Units Drug Present and Declared for Prescription Verification   Hydrocodone                    1267         EXPECTED   ng/mg  creat   Hydromorphone                  505          EXPECTED   ng/mg creat   Norhydrocodone                 2314         EXPECTED   ng/mg creat    Sources of hydrocodone include scheduled prescription medications.    Hydromorphone and norhydrocodone are expected metabolites of    hydrocodone. Hydromorphone is also available as a scheduled    prescription medication. ==================================================================== Test                      Result    Flag   Units      Ref Range   Creatinine              21               mg/dL      >=20 ==================================================================== Declared Medications:  The flagging and interpretation on this report are based on the  following declared medications.  Unexpected results may arise from  inaccuracies in the declared medications.  **Note: The  testing scope of this panel includes these medications:  Hydrocodone  **Note: The testing scope of this panel does not include the  following reported medications:  Acetaminophen  Gabapentin  Ibuprofen ==================================================================== For clinical consultation, please call [phone]. ====================================================================    Laboratory Chemistry Profile (12 mo)  Renal: No results found for requested labs within last 8760 hours.  Lab Results  Component Value Date   GFRAA >60 10/20/2017   GFRNONAA >60 10/20/2017   Hepatic: No results found for requested labs within last 8760 hours. No results found for: AST, ALT Other: No results found for requested labs within last 8760 hours. Note: Above Lab results reviewed.  Imaging  MM 3D SCREEN BREAST BILATERAL CLINICAL DATA:  Screening.  EXAM: DIGITAL SCREENING BILATERAL MAMMOGRAM WITH TOMO AND CAD  COMPARISON:  Previous exam(s).  ACR Breast Density Category c: The breast tissue is heterogeneously dense, which may obscure small  masses.  FINDINGS: There are no findings suspicious for malignancy. Images were processed with CAD.  IMPRESSION: No mammographic evidence of malignancy. A result letter of this screening mammogram will be mailed directly to the patient.  RECOMMENDATION: Screening mammogram in one year. (Code:SM-B-01Y)  BI-RADS CATEGORY  1: Negative.  Electronically Signed   By: Audie Pinto M.D.   On: 10/12/2019 11:45   Assessment  The primary encounter diagnosis was Lumbar radicular pain. Diagnoses of Lumbar radiculopathy, Post laminectomy syndrome, History of lumbar surgery left L3-L4 laminotomy and microdiscectomy October 2018, Postherpetic neuralgia (shingles Oct 2019 Left L4/5 dermatome), Neuropathic pain, and Chronic pain syndrome were also pertinent to this visit.  Plan of Care   I am having Marija P. Scardino start on DULoxetine and HYDROcodone-acetaminophen. I am also having her maintain her ibuprofen, acetaminophen, gabapentin, HYDROcodone-acetaminophen, and tiZANidine.  Pharmacotherapy (Medications Ordered): Meds ordered this encounter  Medications  . DULoxetine (CYMBALTA) 20 MG capsule    Sig: Take 1 capsule (20 mg total) by mouth daily.    Dispense:  30 capsule    Refill:  1  . HYDROcodone-acetaminophen (NORCO) 7.5-325 MG tablet    Sig: Take 1-2 tablets by mouth 2 (two) times daily as needed for severe pain. Must last 30 days. Max 75/month    Dispense:  75 tablet    Refill:  0    [place] STOP ACT - Not applicable. Fill one day early if pharmacy is closed on scheduled refill date.  Marland Kitchen HYDROcodone-acetaminophen (NORCO) 7.5-325 MG tablet    Sig: Take 1-2 tablets by mouth 2 (two) times daily as needed for severe pain. Must last 30 days. Max 75/month    Dispense:  75 tablet    Refill:  0    [place] STOP ACT - Not applicable. Fill one day early if pharmacy is closed on scheduled refill date.  Marland Kitchen tiZANidine (ZANAFLEX) 2 MG tablet    Sig: Take 0.5-1 tablets (1-2 mg total) by mouth at bedtime.     Dispense:  30 tablet    Refill:  1    Do not place this medication, or any other prescription from our practice, on "Automatic Refill". Patient may have prescription filled one day early if pharmacy is closed on scheduled refill date.   Orders:  Orders Placed This Encounter  Procedures  . MR LUMBAR SPINE WO CONTRAST    In addition to any acute findings, please report on degenerative changes related to: (Please specify level(s)) (1) ROM & instability (>53mm displacement) (2) Facet joint (Zygoapophyseal Joint) (3) DDD and/or IVDD (4) Pars defects (5) Previous  surgical changes (Include description of hardware and hardware status, if present) (6) Presence and degree of spondylolisthesis, spondylosis, and/or spondyloarthropathies)  (7) Old Fractures (8) Demineralization (9) Additional bone pathology (10) Stenosis (Central, Lateral Recess, Foraminal) (11) If at all possible, please provide AP diameter (mm) of foraminal and/or central canal.    Standing Status:   Future    Standing Expiration Date:   01/22/2020    Order Specific Question:   What is the patient's sedation requirement?    Answer:   No Sedation    Order Specific Question:   Does the patient have a pacemaker or implanted devices?    Answer:   No    Order Specific Question:   Preferred imaging location?    Answer:   [facility] (table limit-350lbs)    Order Specific Question:   Call Results- Best Contact Number?    Answer:   [phone] ([facility])    Order Specific Question:   Radiology Contrast Protocol - do NOT remove file path    Answer:   [email or web address]   Follow-up plan:   Return in about 3 months (around 01/22/2020).    Recent Visits Date Type Provider Dept  08/28/19 Office Visit Gillis Santa, MD [facility]  Showing recent visits within past 90 days and meeting all other requirements   Today's Visits Date Type Provider Dept  10/24/19 Office Visit  Gillis Santa, MD [facility]  Showing today's visits and meeting all other requirements   Future Appointments No visits were found meeting these conditions.  Showing future appointments within next 90 days and meeting all other requirements   I discussed the assessment and treatment plan with the patient. The patient was provided an opportunity to ask questions and all were answered. The patient agreed with the plan and demonstrated an understanding of the instructions.  Patient advised to call back or seek an in-person evaluation if the symptoms or condition worsens.  Duration of encounter: 30 minutes.  Note by: Gillis Santa, MD Date: 10/24/2019; Time: 3:22 PM

## 2019-10-24 NOTE — Telephone Encounter (Signed)
Her last script is for 12/03/19 and Dr. Holley Raring said for her not to return until April 19. She will run out. Can yall double check those dates for me?

## 2019-10-25 ENCOUNTER — Telehealth: Payer: Self-pay

## 2019-10-25 NOTE — Telephone Encounter (Signed)
You may need to get prior authorization for her medications due to medicaid.

## 2019-10-25 NOTE — Telephone Encounter (Signed)
Not that I know of, but she said last time she had to pay for it until she called [place] to go the prior auth. She said the pharmacy told her that we were the ones who needed to call them with the prior authorization

## 2019-12-04 ENCOUNTER — Other Ambulatory Visit: Payer: Self-pay | Admitting: Student in an Organized Health Care Education/Training Program

## 2019-12-04 DIAGNOSIS — G894 Chronic pain syndrome: Secondary | ICD-10-CM

## 2020-01-01 ENCOUNTER — Encounter: Payer: Self-pay | Admitting: Student in an Organized Health Care Education/Training Program

## 2020-01-02 ENCOUNTER — Ambulatory Visit
Payer: Medicaid Other | Attending: Student in an Organized Health Care Education/Training Program | Admitting: Student in an Organized Health Care Education/Training Program

## 2020-01-02 ENCOUNTER — Encounter: Payer: Self-pay | Admitting: Student in an Organized Health Care Education/Training Program

## 2020-01-02 ENCOUNTER — Other Ambulatory Visit: Payer: Self-pay

## 2020-01-02 DIAGNOSIS — M961 Postlaminectomy syndrome, not elsewhere classified: Secondary | ICD-10-CM | POA: Diagnosis not present

## 2020-01-02 DIAGNOSIS — Z9889 Other specified postprocedural states: Secondary | ICD-10-CM

## 2020-01-02 DIAGNOSIS — M25552 Pain in left hip: Secondary | ICD-10-CM

## 2020-01-02 DIAGNOSIS — M5416 Radiculopathy, lumbar region: Secondary | ICD-10-CM | POA: Diagnosis not present

## 2020-01-02 DIAGNOSIS — M47818 Spondylosis without myelopathy or radiculopathy, sacral and sacrococcygeal region: Secondary | ICD-10-CM

## 2020-01-02 DIAGNOSIS — G894 Chronic pain syndrome: Secondary | ICD-10-CM | POA: Diagnosis not present

## 2020-01-02 DIAGNOSIS — M25551 Pain in right hip: Secondary | ICD-10-CM

## 2020-01-02 MED ORDER — HYDROCODONE-ACETAMINOPHEN 7.5-325 MG PO TABS: 1.0000 | ORAL_TABLET | Freq: Two times a day (BID) | ORAL | 0 refills | Status: DC | PRN

## 2020-01-02 NOTE — Progress Notes (Signed)
Patient: Dana Goodwin  Service Category: E/M  Provider: Gillis Santa, MD  DOB: 09-Mar-1961  DOS: 01/02/2020  Location: Office  MRN: [id number]  Setting: Ambulatory outpatient  Referring Provider: Abran Richard, MD  Type: Established Patient  Specialty: Interventional Pain Management  PCP: Abran Richard, MD  Location: Home  Delivery: TeleHealth     Virtual Encounter - Pain Management PROVIDER NOTE: Information contained herein reflects review and annotations entered in association with encounter. Interpretation of such information and data should be left to medically-trained personnel. Information provided to patient can be located elsewhere in the medical record under "Patient Instructions". Document created using STT-dictation technology, any transcriptional errors that may result from process are unintentional.    Contact & Pharmacy Preferred: [phone] Home: [phone] (home) Mobile: [phone] (mobile) E-mail: [email or web address]  [place], [place] - [address] [place] [address] Phone: [phone] Fax: [phone]   Pre-screening  Dana Goodwin offered "in-Goodwin" vs "virtual" encounter. She indicated preferring virtual for this encounter.   Reason COVID-19*  Social distancing based on CDC and AMA recommendations.   I contacted Dana Goodwin on 01/02/2020 via telephone.      I clearly identified myself as Gillis Santa, MD. I verified that I was speaking with the correct Goodwin using two identifiers (Name: Dana Goodwin, and date of birth: 13-May-1961).  This visit was completed via telephone due to the restrictions of the COVID-19 pandemic. All issues as above were discussed and addressed but no physical exam was performed. If it was felt that the patient should be evaluated in the office, they were directed there. The patient verbally consented to this visit. Patient was unable to complete an audio/visual visit due to Technical  difficulties and/or Lack of internet. Due to the catastrophic nature of the COVID-19 pandemic, this visit was done through audio contact only.  Location of the patient: home address (see Epic for details)  Location of the provider: office  Consent I sought verbal advanced consent from Dana Goodwin for virtual visit interactions. I informed Dana Goodwin of possible security and privacy concerns, risks, and limitations associated with providing "not-in-Goodwin" medical evaluation and management services. I also informed Dana Goodwin of the availability of "in-Goodwin" appointments. Finally, I informed her that there would be a charge for the virtual visit and that she could be  personally, fully or partially, financially responsible for it. Dana Goodwin expressed understanding and agreed to proceed.   Historic Elements   Dana Goodwin is a 59 y.o. year old, female patient evaluated today after her last contact with our practice on 12/04/2019. Dana Goodwin  has a past medical history of Chronic back pain, History of colon polyps, Numbness, Sciatica, and Urinary frequency. She also  has a past surgical history that includes Colonoscopy (N/A, 06/19/2016); Back surgery; Fracture surgery (Left); and Lumbar laminectomy/decompression microdiscectomy (Left, 12/30/2016). Dana Goodwin has a current medication list which includes the following prescription(s): acetaminophen, gabapentin, hydrocodone-acetaminophen, [START ON 02/01/2020] hydrocodone-acetaminophen, and ibuprofen. She  reports that she has been smoking. She has a 18.50 pack-year smoking history. She has never used smokeless tobacco. She reports that she does not drink alcohol or use drugs. Dana Goodwin is allergic to no known allergies.   HPI  Today, she is being contacted for medication management.   Right buttock pain with radiation to right hip and then right leg anterior pain that is most pronounced from knee to ankle. Does not go into foot Very  severe and  causing disability Patient's last MRI was denied from insurance due to unclear reasons Patient has a history of L3-L4 left laminotomy but now she is having right-sided radicular pain which is very concerning for her.  She states that this pain is gotten worse over the last couple of weeks.  She is having difficulty transitioning from a sitting position to a standing position.  She states that being on her feet does help.  At last visit, she was prescribed Cymbalta which resulted in side effects of palpitations and mood changes.  She tried it for 2 weeks and discontinued.  She has not taken gabapentin regularly as it does make her sedated.   Pharmacotherapy Assessment  Analgesic: 12/04/2019  1   10/24/2019  Hydrocodone-Acetamin 7.5-325  75.00  30 Un Pha   [id number]   Nor (6917)   0  18.75 MME  Medicaid       Monitoring: [place] PMP: PDMP reviewed during this encounter.       Pharmacotherapy: No side-effects or adverse reactions reported. Compliance: No problems identified. Effectiveness: Clinically acceptable. Plan: Refer to "POC".  UDS:  Summary  Date Value Ref Range Status  07/06/2019 Note  Final    Comment:    ==================================================================== [employer] Select 13 (MW) ==================================================================== Test                             Result       Flag       Units Drug Present and Declared for Prescription Verification   Hydrocodone                    1267         EXPECTED   ng/mg creat   Hydromorphone                  505          EXPECTED   ng/mg creat   Norhydrocodone                 2314         EXPECTED   ng/mg creat    Sources of hydrocodone include scheduled prescription medications.    Hydromorphone and norhydrocodone are expected metabolites of    hydrocodone. Hydromorphone is also available as a scheduled    prescription medication. ==================================================================== Test                       Result    Flag   Units      Ref Range   Creatinine              21               mg/dL      >=20 ==================================================================== Declared Medications:  The flagging and interpretation on this report are based on the  following declared medications.  Unexpected results may arise from  inaccuracies in the declared medications.  **Note: The testing scope of this panel includes these medications:  Hydrocodone  **Note: The testing scope of this panel does not include the  following reported medications:  Acetaminophen  Gabapentin  Ibuprofen ==================================================================== For clinical consultation, please call [phone]. ====================================================================    Laboratory Chemistry Profile   Renal Lab Results  Component Value Date   BUN 13 10/20/2017   CREATININE 0.67 10/20/2017   GFRAA >60 10/20/2017   GFRNONAA >60 10/20/2017  Hepatic No results found for: AST, ALT, ALBUMIN, ALKPHOS, HCVAB, AMYLASE, LIPASE, AMMONIA   Electrolytes Lab Results  Component Value Date   NA 139 10/20/2017   K 4.1 10/20/2017   CL 103 10/20/2017   CALCIUM 9.7 10/20/2017     Bone No results found for: VD25OH, VD125OH2TOT, UV2536UY4, IH4742VZ5, 25OHVITD1, 25OHVITD2, 25OHVITD3, TESTOFREE, TESTOSTERONE   Inflammation (CRP: Acute Phase) (ESR: Chronic Phase) No results found for: CRP, ESRSEDRATE, LATICACIDVEN     Note: Above Lab results reviewed.  Imaging  MM 3D SCREEN BREAST BILATERAL CLINICAL DATA:  Screening.  EXAM: DIGITAL SCREENING BILATERAL MAMMOGRAM WITH TOMO AND CAD  COMPARISON:  Previous exam(s).  ACR Breast Density Category c: The breast tissue is heterogeneously dense, which may obscure small masses.  FINDINGS: There are no findings suspicious for malignancy. Images were processed with CAD.  IMPRESSION: No mammographic evidence of malignancy. A  result letter of this screening mammogram will be mailed directly to the patient.  RECOMMENDATION: Screening mammogram in one year. (Code:SM-B-01Y)  BI-RADS CATEGORY  1: Negative.  Electronically Signed   By: Audie Pinto M.D.   On: 10/12/2019 11:45  Assessment  The primary encounter diagnosis was Lumbar radicular pain. Diagnoses of Lumbar radiculopathy, Post laminectomy syndrome, History of lumbar surgery left L3-L4 laminotomy and microdiscectomy October 2018, Chronic pain syndrome, Bilateral hip pain, and SI joint arthritis were also pertinent to this visit.  Plan of Care  Problem-specific:  Ms. TONIETTE DEVERA has a current medication list which includes the following long-term medication(s): gabapentin.   59 year old female with a history of left L3-L4 laminotomy with postlaminectomy pain syndrome and lumbar radicular pain.  Patient's pain is typically on her left side but over the last 4 to 6 weeks, her pain has increased along her right side which radiates to her right hip and down her right leg and anterior pattern most pronounced from knee to ankle.  I am concerned about foraminal stenosis and adjacent segment disease which is why I ordered a lumbar MRI at the previous visit which was denied.  Insurance company stated that x-rays should be obtained first which in my opinion would not evaluate what we are looking for.  This is frustrating.  Patient is in increased pain and the study of choice to further evaluate this pain in greatest detail is an MRI.  We will start with lumbar spine, hip and SI joint x-ray and then will order an MRI.  Future considerations could include spinal cord stimulator trial.   Refill hydrocodone as below  Pharmacotherapy (Medications Ordered): Meds ordered this encounter  Medications  . HYDROcodone-acetaminophen (NORCO) 7.5-325 MG tablet    Sig: Take 1-2 tablets by mouth 2 (two) times daily as needed for severe pain. Must last 30 days. Max 75/month     Dispense:  75 tablet    Refill:  0    [place] STOP ACT - Not applicable. Fill one day early if pharmacy is closed on scheduled refill date.  [facility] HYDROcodone-acetaminophen (NORCO) 7.5-325 MG tablet    Sig: Take 1-2 tablets by mouth 2 (two) times daily as needed for severe pain. Must last 30 days. Max 75/month    Dispense:  75 tablet    Refill:  0    [place] STOP ACT - Not applicable. Fill one day early if pharmacy is closed on scheduled refill date.   Orders:  Orders Placed This Encounter  Procedures  . DG Lumbar Spine Complete W/Bend    Patient presents with axial pain with  possible radicular component.  In addition to any acute findings, please report on:  1. Facet (Zygapophyseal) joint DJD (Hypertrophy, space narrowing, subchondral sclerosis, and/or osteophyte formation) 2. DDD and/or IVDD (Loss of disc height, desiccation or "Black disc disease") 3. Pars defects 4. Spondylolisthesis, spondylosis, and/or spondyloarthropathies (include Degree/Grade of displacement in mm) 5. Vertebral body Fractures, including age (old, new/acute) 5. Modic Type Changes 7. Demineralization 8. Bone pathology 9. Central, Lateral Recess, and/or Foraminal Stenosis (include AP diameter of stenosis in mm) 10. Surgical changes (hardware type, status, and presence of fibrosis)  NOTE: Please specify level(s) and laterality. If applicable: Please indicate ROM and/or evidence of instability (>18m displacement between flexion and extension views)    Standing Status:   Future    Standing Expiration Date:   04/03/2020    Order Specific Question:   Reason for Exam (SYMPTOM  OR DIAGNOSIS REQUIRED)    Answer:   Low back pain    Order Specific Question:   Is patient pregnant?    Answer:   No    Order Specific Question:   Preferred imaging location?    Answer:   [facility]    Order Specific Question:   Call Results- Best Contact Number?    Answer:   [phone][facility]    Order Specific Question:    Radiology Contrast Protocol - do NOT remove file path    Answer:   [email or web address]  . DG Si Joints    Standing Status:   Future    Standing Expiration Date:   04/03/2020    Order Specific Question:   Reason for Exam (SYMPTOM  OR DIAGNOSIS REQUIRED)    Answer:   Sacroiliac joint pain    Order Specific Question:   Is the patient pregnant?    Answer:   No    Order Specific Question:   Preferred imaging location?    Answer:   [facility]    Order Specific Question:   Call Results- Best Contact Number?    Answer:   [phone]([facility]  . DG HIP UNILAT W OR W/O PELVIS 2-3 VIEWS LEFT    Standing Status:   Future    Standing Expiration Date:   01/01/2021    Scheduling Instructions:     Please describe any evidence of DJD, such as joint narrowing, asymmetry, cysts, or any anomalies in bone density, production, or erosion.    Order Specific Question:   Reason for Exam (SYMPTOM  OR DIAGNOSIS REQUIRED)    Answer:   Sacroiliac joint pain    Order Specific Question:   Is the patient pregnant?    Answer:   No    Order Specific Question:   Preferred imaging location?    Answer:   [facility]    Order Specific Question:   Call Results- Best Contact Number?    Answer:   [phone](Pain Clinic facility) (Dr. NDossie Arbour  . DG HIP UNILAT W OR W/O PELVIS 2-3 VIEWS RIGHT    Standing Status:   Future    Standing Expiration Date:   01/01/2021    Scheduling Instructions:     Please describe any evidence of DJD, such as joint narrowing, asymmetry, cysts, or any anomalies in bone density, production, or erosion.    Order Specific Question:   Reason for Exam (SYMPTOM  OR DIAGNOSIS REQUIRED)    Answer:   Sacroiliac joint pain    Order Specific Question:   Is the patient pregnant?  Answer:   No    Order Specific Question:   Preferred imaging location?    Answer:   [facility]    Order Specific Question:   Call Results- Best Contact  Number?    Answer:   [phone] (Pain Clinic facility) (Dr. Dossie Arbour)   Follow-up plan:   Return in about 8 weeks (around 02/27/2020) for Medication Management, virtual.    Recent Visits Date Type Provider Dept  10/24/19 Office Visit Gillis Santa, MD [facility]  Showing recent visits within past 90 days and meeting all other requirements   Today's Visits Date Type Provider Dept  01/02/20 Office Visit Gillis Santa, MD [facility]  Showing today's visits and meeting all other requirements   Future Appointments No visits were found meeting these conditions.  Showing future appointments within next 90 days and meeting all other requirements   I discussed the assessment and treatment plan with the patient. The patient was provided an opportunity to ask questions and all were answered. The patient agreed with the plan and demonstrated an understanding of the instructions.  Patient advised to call back or seek an in-Goodwin evaluation if the symptoms or condition worsens.  Duration of encounter: 25 minutes.  Note by: Gillis Santa, MD Date: 01/02/2020; Time: 8:51 AM

## 2020-01-05 ENCOUNTER — Ambulatory Visit
Admission: RE | Admit: 2020-01-05 | Discharge: 2020-01-05 | Disposition: A | Discharge status: Home / Self Care | Payer: Medicaid Other | Source: Ambulatory Visit | Attending: Student in an Organized Health Care Education/Training Program | Admitting: Student in an Organized Health Care Education/Training Program

## 2020-01-05 ENCOUNTER — Other Ambulatory Visit: Payer: Self-pay

## 2020-01-05 DIAGNOSIS — M25552 Pain in left hip: Secondary | ICD-10-CM

## 2020-01-05 DIAGNOSIS — M25551 Pain in right hip: Secondary | ICD-10-CM | POA: Insufficient documentation

## 2020-01-05 DIAGNOSIS — M47818 Spondylosis without myelopathy or radiculopathy, sacral and sacrococcygeal region: Secondary | ICD-10-CM | POA: Insufficient documentation

## 2020-01-05 DIAGNOSIS — Z9889 Other specified postprocedural states: Secondary | ICD-10-CM | POA: Insufficient documentation

## 2020-01-05 DIAGNOSIS — M961 Postlaminectomy syndrome, not elsewhere classified: Secondary | ICD-10-CM

## 2020-01-22 ENCOUNTER — Ambulatory Visit: Payer: Medicaid Other | Admitting: Student in an Organized Health Care Education/Training Program

## 2020-02-26 ENCOUNTER — Encounter: Payer: Self-pay | Admitting: Student in an Organized Health Care Education/Training Program

## 2020-02-27 ENCOUNTER — Telehealth: Payer: Self-pay | Admitting: *Deleted

## 2020-02-27 ENCOUNTER — Other Ambulatory Visit: Payer: Self-pay

## 2020-02-27 ENCOUNTER — Encounter: Payer: Self-pay | Admitting: Student in an Organized Health Care Education/Training Program

## 2020-02-27 ENCOUNTER — Ambulatory Visit
Payer: Medicaid Other | Attending: Student in an Organized Health Care Education/Training Program | Admitting: Student in an Organized Health Care Education/Training Program

## 2020-02-27 DIAGNOSIS — M961 Postlaminectomy syndrome, not elsewhere classified: Secondary | ICD-10-CM

## 2020-02-27 DIAGNOSIS — Z9889 Other specified postprocedural states: Secondary | ICD-10-CM | POA: Diagnosis not present

## 2020-02-27 DIAGNOSIS — M5416 Radiculopathy, lumbar region: Secondary | ICD-10-CM

## 2020-02-27 DIAGNOSIS — G894 Chronic pain syndrome: Secondary | ICD-10-CM

## 2020-02-27 MED ORDER — HYDROCODONE-ACETAMINOPHEN 7.5-325 MG PO TABS: 1.0000 | ORAL_TABLET | Freq: Three times a day (TID) | ORAL | 0 refills | Status: DC | PRN

## 2020-02-27 NOTE — Progress Notes (Signed)
Patient: Dana Goodwin  Service Category: E/M  Provider: Gillis Santa, MD  DOB: Oct 26, 1960  DOS: 02/27/2020  Location: Office  MRN: [id number]  Setting: Ambulatory outpatient  Referring Provider: Abran Richard, MD  Type: Established Patient  Specialty: Interventional Pain Management  PCP: Abran Richard, MD  Location: Home  Delivery: TeleHealth     Virtual Encounter - Pain Management PROVIDER NOTE: Information contained herein reflects review and annotations entered in association with encounter. Interpretation of such information and data should be left to medically-trained personnel. Information provided to patient can be located elsewhere in the medical record under "Patient Instructions". Document created using STT-dictation technology, any transcriptional errors that may result from process are unintentional.    Contact & Pharmacy Preferred: [phone] Home: [phone] (home) Mobile: [phone] (mobile) E-mail: [email or web address]  [place], [place] - [address] [place] [address] Phone: [phone] Fax: [phone]   Pre-screening  Dana Goodwin offered "in-person" vs "virtual" encounter. She indicated preferring virtual for this encounter.   Reason COVID-19*  Social distancing based on CDC and AMA recommendations.   I contacted Dana Goodwin on 02/27/2020 via video conference.      I clearly identified myself as Gillis Santa, MD. I verified that I was speaking with the correct person using two identifiers (Name: Dana Goodwin, and date of birth: 25-Sep-1961).  Consent I sought verbal advanced consent from Dana Goodwin for virtual visit interactions. I informed Dana Goodwin of possible security and privacy concerns, risks, and limitations associated with providing "not-in-person" medical evaluation and management services. I also informed Dana Goodwin of the availability of "in-person" appointments. Finally, I informed her that  there would be a charge for the virtual visit and that she could be  personally, fully or partially, financially responsible for it. Dana Goodwin expressed understanding and agreed to proceed.   Historic Elements   Dana Goodwin is a 59 y.o. year old, female patient evaluated today after her last contact with our practice on 01/02/2020. Dana Goodwin  has a past medical history of Chronic back pain, History of colon polyps, Numbness, Sciatica, and Urinary frequency. She also  has a past surgical history that includes Colonoscopy (N/A, 06/19/2016); Back surgery; Fracture surgery (Left); and Lumbar laminectomy/decompression microdiscectomy (Left, 12/30/2016). Dana Goodwin has a current medication list which includes the following prescription(s): acetaminophen, gabapentin, [START ON 02/28/2020] hydrocodone-acetaminophen, [START ON 03/29/2020] hydrocodone-acetaminophen, [START ON 04/28/2020] hydrocodone-acetaminophen, and ibuprofen. She  reports that she has been smoking. She has a 18.50 pack-year smoking history. She has never used smokeless tobacco. She reports that she does not drink alcohol or use drugs. Dana Goodwin is allergic to no known allergies.   HPI  Today, she is being contacted for worsening of previously known (established) problem and MM.  Patient endorses worsening right leg pain related to lumbar radiculopathy.  This is been chronic in nature has been worsening over the last 10 months.  An MRI was ordered due to the severity of her pain at her last visit which was denied by her insurance.  They recommend that lumbar spine x-rays be obtained prior.  This was done which shows chronic L2 compression fracture and severe lumbar degenerative disc disease.  Given the patient's dermatomal pain pattern, difficulty walking and performing activities of daily living, is very important that we obtain a lumbar MRI to further evaluate her symptoms.  She has done physical therapy in the past and is in no position to  perform PT given the extent of her pain.  Given her increased pain and lack of response to epidural steroid injection, will increase her hydrocodone temporarily to 7.5 mg 3 times daily as needed.  Depending upon MRI results, patient can consider spinal cord stimulator trial versus neurosurgical consult for surgical decompression.  Pharmacotherapy Assessment  Analgesic:  02/01/2020  1   01/02/2020  Hydrocodone-Acetamin 7.5-325  75.00  30 Un Pha   [id number]   [place] ([id number])   0  18.75 MME  Medicaid   [place]  01/02/2020  1   01/02/2020  Hydrocodone-Acetamin 7.5-325  75.00  30 Un Pha   [id number]   [place] ([id number])   0  18.75 MME  Medicaid   [place]    Monitoring: [place] PMP: PDMP reviewed during this encounter.       Pharmacotherapy: No side-effects or adverse reactions reported. Compliance: No problems identified. Effectiveness: Clinically acceptable. Plan: Refer to "POC".  UDS:  Summary  Date Value Ref Range Status  07/06/2019 Note  Final    Comment:    ==================================================================== [facility] Select 13 (MW) ==================================================================== Test                             Result       Flag       Units Drug Present and Declared for Prescription Verification   Hydrocodone                    1267         EXPECTED   ng/mg creat   Hydromorphone                  505          EXPECTED   ng/mg creat   Norhydrocodone                 2314         EXPECTED   ng/mg creat    Sources of hydrocodone include scheduled prescription medications.    Hydromorphone and norhydrocodone are expected metabolites of    hydrocodone. Hydromorphone is also available as a scheduled    prescription medication. ==================================================================== Test                      Result    Flag   Units      Ref Range   Creatinine              21               mg/dL       >=20 ==================================================================== Declared Medications:  The flagging and interpretation on this report are based on the  following declared medications.  Unexpected results may arise from  inaccuracies in the declared medications.  **Note: The testing scope of this panel includes these medications:  Hydrocodone  **Note: The testing scope of this panel does not include the  following reported medications:  Acetaminophen  Gabapentin  Ibuprofen ==================================================================== For clinical consultation, please call [phone]. ====================================================================    Laboratory Chemistry Profile   Renal Lab Results  Component Value Date   BUN 13 10/20/2017   CREATININE 0.67 10/20/2017   GFRAA >60 10/20/2017   GFRNONAA >60 10/20/2017     Hepatic No results found for: AST, ALT, ALBUMIN, ALKPHOS, HCVAB, AMYLASE, LIPASE, AMMONIA   Electrolytes Lab Results  Component Value Date   NA 139 10/20/2017   K  4.1 10/20/2017   CL 103 10/20/2017   CALCIUM 9.7 10/20/2017     Bone No results found for: VD25OH, VD125OH2TOT, KC1275TZ0, YF7494WH6, 25OHVITD1, 25OHVITD2, 25OHVITD3, TESTOFREE, TESTOSTERONE   Inflammation (CRP: Acute Phase) (ESR: Chronic Phase) No results found for: CRP, ESRSEDRATE, LATICACIDVEN     Note: Above Lab results reviewed.  Imaging  DG HIP UNILAT W OR W/O PELVIS 2-3 VIEWS LEFT CLINICAL DATA:  Sacroiliac joint pain.  EXAM: DG HIP (WITH OR WITHOUT PELVIS) 2-3V LEFT  COMPARISON:  Lumbar spine 10/20/2017.  FINDINGS: Degenerative changes lumbar spine, both SI joints, both hips. Tiny bony density noted adjacent to the left acetabulum most likely secondary to degenerative disease. No evidence of erosive arthropathy. No acute bony abnormality identified no evidence of fracture dislocation. Left hip is intact. Pelvic calcifications consistent  phleboliths.  IMPRESSION: 1. Degenerative changes lumbar spine, both SI joints, both hips. No evidence of erosive arthropathy.  2.  No acute abnormality.  Left hip is intact.  Electronically Signed   By: Marcello Moores  Register   On: 01/05/2020 08:30 DG HIP UNILAT W OR W/O PELVIS 2-3 VIEWS RIGHT CLINICAL DATA:  Pain  EXAM: DG HIP (WITH OR WITHOUT PELVIS) 2-3V RIGHT  COMPARISON:  None.  FINDINGS: There is no evidence of hip fracture or dislocation. There is no evidence of arthropathy or other focal bone abnormality.  IMPRESSION: Negative.  Electronically Signed   By: Franchot Gallo M.D.   On: 01/05/2020 08:28 DG Lumbar Spine Complete W/Bend CLINICAL DATA:  Back pain.  EXAM: LUMBAR SPINE - COMPLETE WITH BENDING VIEWS  COMPARISON:  Lumbar spine radiographs 03/02/2018  FINDINGS: Normal lumbar alignment. No abnormal movement on flexion or extension.  Chronic compression fracture superior endplate of L2 is unchanged from the prior study. No other fracture or mass lesion identified.  Moderate disc degeneration and spurring L3-4 and L4-5. Incomplete segmentation L5-S1. Mild disc degeneration L1-2 and L2-3. No pars defect identified.  IMPRESSION: Chronic mild compression fracture L2 unchanged.  No other fracture  Multilevel degenerative change most prominent L3-4 and L4-5. No change from prior study of 03/02/2018.  Electronically Signed   By: Franchot Gallo M.D.   On: 01/05/2020 08:26 DG Si Joints CLINICAL DATA:  SI joint arthritis.  Back pain  EXAM: BILATERAL SACROILIAC JOINTS - 3+ VIEW  COMPARISON:  None.  FINDINGS: The sacroiliac joint spaces are maintained and there is no evidence of arthropathy. No other bone abnormalities are seen.  IMPRESSION: Negative.  Electronically Signed   By: Franchot Gallo M.D.   On: 01/05/2020 08:23  Assessment  The primary encounter diagnosis was Lumbar radicular pain. Diagnoses of Lumbar radiculopathy, Post laminectomy  syndrome, History of lumbar surgery left L3-L4 laminotomy and microdiscectomy October 2018, and Chronic pain syndrome were also pertinent to this visit.  Plan of Care  [name] has a current medication list which includes the following long-term medication(s): gabapentin.  1. Lumbar radicular pain - MR LUMBAR SPINE WO CONTRAST; Future  2. Lumbar radiculopathy - MR LUMBAR SPINE WO CONTRAST; Future  3. Post laminectomy syndrome - MR LUMBAR SPINE WO CONTRAST; Future  4. History of lumbar surgery left L3-L4 laminotomy and microdiscectomy October 2018 - MR LUMBAR SPINE WO CONTRAST; Future  5. Chronic pain syndrome - MR LUMBAR SPINE WO CONTRAST; Future - HYDROcodone-acetaminophen (NORCO) 7.5-325 MG tablet; Take 1 tablet by mouth every 8 (eight) hours as needed for severe pain. Must last 30 days.  Dispense: 90 tablet; Refill: 0 - HYDROcodone-acetaminophen (NORCO) 7.5-325 MG  tablet; Take 1 tablet by mouth every 8 (eight) hours as needed for severe pain. Must last 30 days.  Dispense: 90 tablet; Refill: 0 - HYDROcodone-acetaminophen (NORCO) 7.5-325 MG tablet; Take 1 tablet by mouth every 8 (eight) hours as needed for severe pain. Must last 30 days.  Dispense: 90 tablet; Refill: 0   Pharmacotherapy (Medications Ordered): Meds ordered this encounter  Medications  . HYDROcodone-acetaminophen (NORCO) 7.5-325 MG tablet    Sig: Take 1 tablet by mouth every 8 (eight) hours as needed for severe pain. Must last 30 days.    Dispense:  90 tablet    Refill:  0    [place] STOP ACT - Not applicable. Fill one day early if pharmacy is closed on scheduled refill date.  [place] HYDROcodone-acetaminophen (NORCO) 7.5-325 MG tablet    Sig: Take 1 tablet by mouth every 8 (eight) hours as needed for severe pain. Must last 30 days.    Dispense:  90 tablet    Refill:  0    [place] STOP ACT - Not applicable. Fill one day early if pharmacy is closed on scheduled refill date.  Mar[place] Kitchen HYDROcodone-acetaminophen (NORCO) 7.5-325 MG  tablet    Sig: Take 1 tablet by mouth every 8 (eight) hours as needed for severe pain. Must last 30 days.    Dispense:  90 tablet    Refill:  0    [place] STOP ACT - Not applicable. Fill one day early if pharmacy is closed on scheduled refill date.   Orders:  Orders Placed This Encounter  Procedures  . MR LUMBAR SPINE WO CONTRAST    Patient presents with axial pain with possible radicular component.  In addition to any acute findings, please report on:  1. Facet (Zygapophyseal) joint DJD (Hypertrophy, space narrowing, subchondral sclerosis, and/or osteophyte formation) 2. DDD and/or IVDD (Loss of disc height, desiccation or "Black disc disease") 3. Pars defects 4. Spondylolisthesis, spondylosis, and/or spondyloarthropathies (include Degree/Grade of displacement in mm) 5. Vertebral body Fractures, including age (old, new/acute) 30. Modic Type Changes 7. Demineralization 8. Bone pathology 9. Central, Lateral Recess, and/or Foraminal Stenosis (include AP diameter of stenosis in mm) 10. Surgical changes (hardware type, status, and presence of fibrosis)  NOTE: Please specify level(s) and laterality.    Standing Status:   Future    Standing Expiration Date:   05/29/2020    Order Specific Question:   What is the patient's sedation requirement?    Answer:   No Sedation    Order Specific Question:   Does the patient have a pacemaker or implanted devices?    Answer:   No    Order Specific Question:   Preferred imaging location?    Answer:   [facility] (table limit-350lbs)    Order Specific Question:   Call Results- Best Contact Number?    Answer:   [phone] ([facility])    Order Specific Question:   Radiology Contrast Protocol - do NOT remove file path    Answer:   [email or web address]   Follow-up plan:   Return in about 3 months (around 05/29/2020) for Medication Management, in person.    Recent Visits Date Type Provider Dept  01/02/20 Office  Visit Gillis Santa, MD [facility]  Showing recent visits within past 90 days and meeting all other requirements   Today's Visits Date Type Provider Dept  02/27/20 Telemedicine Gillis Santa, MD [facility]  Showing today's visits and meeting all other requirements   Future Appointments No visits were found  meeting these conditions.  Showing future appointments within next 90 days and meeting all other requirements   I discussed the assessment and treatment plan with the patient. The patient was provided an opportunity to ask questions and all were answered. The patient agreed with the plan and demonstrated an understanding of the instructions.  Patient advised to call back or seek an in-person evaluation if the symptoms or condition worsens.  Duration of encounter: 31 minutes.  Note by: Gillis Santa, MD Date: 02/27/2020; Time: 10:56 AM

## 2020-02-28 ENCOUNTER — Telehealth: Payer: Self-pay | Admitting: Student in an Organized Health Care Education/Training Program

## 2020-02-28 NOTE — Telephone Encounter (Signed)
Patient states pharmacy told her she needs PA for her medication.

## 2020-02-28 NOTE — Telephone Encounter (Signed)
PA done and faxed to [facility] 02/28/20.

## 2020-03-14 ENCOUNTER — Other Ambulatory Visit: Payer: Self-pay

## 2020-03-14 ENCOUNTER — Ambulatory Visit
Admission: RE | Admit: 2020-03-14 | Discharge: 2020-03-14 | Disposition: A | Discharge status: Home / Self Care | Payer: Medicaid Other | Source: Ambulatory Visit | Attending: Student in an Organized Health Care Education/Training Program | Admitting: Student in an Organized Health Care Education/Training Program

## 2020-03-14 DIAGNOSIS — Z9889 Other specified postprocedural states: Secondary | ICD-10-CM | POA: Diagnosis present

## 2020-03-14 DIAGNOSIS — M961 Postlaminectomy syndrome, not elsewhere classified: Secondary | ICD-10-CM | POA: Diagnosis present

## 2020-03-14 DIAGNOSIS — G894 Chronic pain syndrome: Secondary | ICD-10-CM | POA: Insufficient documentation

## 2020-03-14 DIAGNOSIS — M5416 Radiculopathy, lumbar region: Secondary | ICD-10-CM | POA: Diagnosis present

## 2020-05-28 ENCOUNTER — Encounter: Payer: Medicaid Other | Admitting: Student in an Organized Health Care Education/Training Program

## 2020-05-30 ENCOUNTER — Encounter: Payer: Self-pay | Admitting: Student in an Organized Health Care Education/Training Program

## 2020-05-30 ENCOUNTER — Ambulatory Visit
Payer: Medicaid Other | Attending: Student in an Organized Health Care Education/Training Program | Admitting: Student in an Organized Health Care Education/Training Program

## 2020-05-30 ENCOUNTER — Other Ambulatory Visit: Payer: Self-pay

## 2020-05-30 VITALS — BP 108/84 | HR 63 | Temp 97.3°F | Resp 18 | Ht 64.0 in | Wt 118.0 lb

## 2020-05-30 DIAGNOSIS — G894 Chronic pain syndrome: Secondary | ICD-10-CM | POA: Diagnosis present

## 2020-05-30 DIAGNOSIS — M961 Postlaminectomy syndrome, not elsewhere classified: Secondary | ICD-10-CM | POA: Diagnosis not present

## 2020-05-30 DIAGNOSIS — M47818 Spondylosis without myelopathy or radiculopathy, sacral and sacrococcygeal region: Secondary | ICD-10-CM | POA: Diagnosis present

## 2020-05-30 DIAGNOSIS — M5416 Radiculopathy, lumbar region: Secondary | ICD-10-CM | POA: Insufficient documentation

## 2020-05-30 DIAGNOSIS — B0229 Other postherpetic nervous system involvement: Secondary | ICD-10-CM | POA: Insufficient documentation

## 2020-05-30 DIAGNOSIS — Z9889 Other specified postprocedural states: Secondary | ICD-10-CM | POA: Diagnosis not present

## 2020-05-30 DIAGNOSIS — M25551 Pain in right hip: Secondary | ICD-10-CM | POA: Insufficient documentation

## 2020-05-30 DIAGNOSIS — M25552 Pain in left hip: Secondary | ICD-10-CM | POA: Diagnosis present

## 2020-05-30 MED ORDER — HYDROCODONE-ACETAMINOPHEN 7.5-325 MG PO TABS: 1.0000 | ORAL_TABLET | Freq: Three times a day (TID) | ORAL | 0 refills | Status: DC | PRN

## 2020-05-30 NOTE — Progress Notes (Signed)
PROVIDER NOTE: Information contained herein reflects review and annotations entered in association with encounter. Interpretation of such information and data should be left to medically-trained personnel. Information provided to patient can be located elsewhere in the medical record under "Patient Instructions". Document created using STT-dictation technology, any transcriptional errors that may result from process are unintentional.    Patient: Dana Goodwin  Service Category: E/M  Provider: Gillis Santa, MD  DOB: 1961/07/30  DOS: 05/30/2020  Specialty: Interventional Pain Management  MRN: [id number]  Setting: Ambulatory outpatient  PCP: Abran Richard, MD  Type: Established Patient    Referring Provider: Abran Richard, MD  Location: Office  Delivery: Face-to-face     HPI  Reason for encounter: Dana Goodwin, a 59 y.o. year old female, is here today for evaluation and management of her Lumbar radicular pain [M54.16]. Dana Goodwin primary complain today is Back Pain (low) Last encounter: Practice (02/28/2020). My last encounter with her was on 02/28/2020. Pertinent problems: Dana Goodwin has Lumbar disc herniation; History of lumbar surgery left L3-L4 laminotomy and microdiscectomy October 2018; Postherpetic neuralgia (shingles Oct 2019 Left L4/5 dermatome); Neuropathic pain; Post laminectomy syndrome; Chronic pain syndrome; Lumbar facet arthropathy; and Lumbar radiculopathy on their pertinent problem list. Pain Assessment: Severity of Chronic pain is reported as a 5 /10. Location: Back Lower/radiates down to buttock on right then from knee down in the front, sometimes on left leg has pins and needl;es. Onset: More than a month ago. Quality: Pins and needles, Sharp. Timing: Constant. Modifying factor(s): walking. Vitals:  height is $RemoveB'5\' 4"'YTDHnQqG$  (1.626 m) and weight is 118 lb (53.5 kg). Her temperature is 97.3 F (36.3 C) (abnormal). Her blood pressure is 108/84 and her pulse is 63. Her respiration is 18 and  oxygen saturation is 100%.   Patient saw her neurosurgeon yesterday and also received an injection in her lumbar spine yesterday at the clinic.  She states that she is not experiencing any pain relief from it.  She is not aware whether she is going to be having surgery.  She states that she was instructed to call the clinic in 1 week to inform them how she was doing.  She is here today for medication refill.  She utilizes hydrocodone 7.5 mg 3 times a day to help manage her pain.  We discussed weaning in the future after hopefully surgical intervention has been discussed as the patient has failed medication management and injection therapy.  We have limited options at this time which I was very clear with her about.  No further escalation of her opioid medications.  Consider weaning in the future.  Pharmacotherapy Assessment   04/27/2020  1   02/27/2020  Hydrocodone-Acetamin 7.5-325  90.00  30 Un Pha   [id number]   Nor (6917)   0/0  22.50 MME  Comm Ins   [place]      Monitoring:  PMP: PDMP reviewed during this encounter.       Pharmacotherapy: No side-effects or adverse reactions reported. Compliance: No problems identified. Effectiveness: Clinically acceptable.  Dewayne Shorter, RN  05/30/2020  8:19 AM  Signed Nursing Pain Medication Assessment:  Safety precautions to be maintained throughout the outpatient stay will include: orient to surroundings, keep bed in low position, maintain call bell within reach at all times, provide assistance with transfer out of bed and ambulation.  Medication Inspection Compliance: Dana Goodwin did not comply with our request to bring her pills to be counted. She was reminded that bringing the  medication bottles, even when empty, is a requirement.  Medication: None brought in. Pill/Patch Count: None available to be counted. Bottle Appearance: No container available. Did not bring bottle(s) to appointment. Filled Date: N/A Last Medication intake:  Yesterday   Reminded to bring pills/empty bottle to appointment    UDS:  Summary  Date Value Ref Range Status  07/06/2019 Note  Final    Comment:    ==================================================================== [employer]ox[employer] Select 13 (MW) ==================================================================== Test                             Result       Flag       Units Drug Present and Declared for Prescription Verification   Hydrocodone                    1267         EXPECTED   ng/mg creat   Hydromorphone                  505          EXPECTED   ng/mg creat   Norhydrocodone                 2314         EXPECTED   ng/mg creat    Sources of hydrocodone include scheduled prescription medications.    Hydromorphone and norhydrocodone are expected metabolites of    hydrocodone. Hydromorphone is also available as a scheduled    prescription medication. ==================================================================== Test                      Result    Flag   Units      Ref Range   Creatinine              21               mg/dL      >=03 ==================================================================== Declared Medications:  The flagging and interpretation on this report are based on the  following declared medications.  Unexpected results may arise from  inaccuracies in the declared medications.  **Note: The testing scope of this panel includes these medications:  Hydrocodone  **Note: The testing scope of this panel does not include the  following reported medications:  Acetaminophen  Gabapentin  Ibuprofen ==================================================================== For clinical consultation, please call [phone]. ====================================================================      ROS  Constitutional: Denies any fever or chills Gastrointestinal: No reported hemesis, hematochezia, vomiting, or acute GI distress Musculoskeletal: Low back, bilateral leg  pain Neurological: No reported episodes of acute onset apraxia, aphasia, dysarthria, agnosia, amnesia, paralysis, loss of coordination, or loss of consciousness  Medication Review  HYDROcodone-acetaminophen, acetaminophen, gabapentin, and ibuprofen  History Review  Allergy: Dana Goodwin is allergic to no known allergies. Drug: Dana Goodwin  reports no history of drug use. Alcohol:  reports no history of alcohol use. Tobacco:  reports that she has been smoking. She has a 18.50 pack-year smoking history. She has never used smokeless tobacco. Social: Dana Goodwin  reports that she has been smoking. She has a 18.50 pack-year smoking history. She has never used smokeless tobacco. She reports that she does not drink alcohol and does not use drugs. Medical:  has a past medical history of Chronic back pain, History of colon polyps, Numbness, Sciatica, and Urinary frequency. Surgical: Dana Goodwin  has a past surgical history that includes Colonoscopy (N/A,  06/19/2016); Back surgery; Fracture surgery (Left); and Lumbar laminectomy/decompression microdiscectomy (Left, 12/30/2016). Family: family history includes Diabetes in her brother; Heart attack in her brother and mother; Prostate cancer in her father.  Laboratory Chemistry Profile   Renal Lab Results  Component Value Date   BUN 13 10/20/2017   CREATININE 0.67 10/20/2017   GFRAA >60 10/20/2017   GFRNONAA >60 10/20/2017     Hepatic No results found for: AST, ALT, ALBUMIN, ALKPHOS, HCVAB, AMYLASE, LIPASE, AMMONIA   Electrolytes Lab Results  Component Value Date   NA 139 10/20/2017   K 4.1 10/20/2017   CL 103 10/20/2017   CALCIUM 9.7 10/20/2017     Bone No results found for: VD25OH, VD125OH2TOT, VF6433IR5, JO8416SA6, 25OHVITD1, 25OHVITD2, 25OHVITD3, TESTOFREE, TESTOSTERONE   Inflammation (CRP: Acute Phase) (ESR: Chronic Phase) No results found for: CRP, ESRSEDRATE, LATICACIDVEN     Note: Above Lab results reviewed.  Recent Imaging Review   MR LUMBAR SPINE WO CONTRAST CLINICAL DATA:  Lumbar radiculopathy for greater than 6 weeks. Prior surgery. New symptoms.  EXAM: MRI LUMBAR SPINE WITHOUT CONTRAST  TECHNIQUE: Multiplanar, multisequence MR imaging of the lumbar spine was performed. No intravenous contrast was administered.  COMPARISON:  None.  FINDINGS: Segmentation: Transitional anatomy is again noted. The L5 vertebral body is partially incorporated into the sacrum. Least the same numbering system as has been previously utilized.  Alignment: No significant listhesis is present. Mild leftward curvature is centered at L2-3.  Vertebrae: Remote superior endplate fracture at L2 is stable. Chronic fatty endplate marrow changes are present L4-5, right greater than left. Mild edematous marrow changes are present posteriorly at L3-4, right greater than left. No other significant marrow edema is present. A 5 mm hemangioma is present T12. There is some fatty infiltration of the sacrum. Remote fracture with focal lordosis is present at S3-4. Mineralization was within normal limits on the most recent radiographs.  Conus medullaris and cauda equina: Conus extends to the L1 level. Conus and cauda equina appear normal.  Paraspinal and other soft tissues: Limited imaging the abdomen is unremarkable. There is no significant adenopathy. No solid organ lesions are present.  Disc levels:  T12-L1: Normal  L1-2: A leftward disc protrusion has progressed. Mild facet hypertrophy is noted. Mild foraminal narrowing is present bilaterally.  L2-3: Broad-based disc protrusion is present. Central annular tear is present. Tiny protrusion is noted. No significant stenosis or change is evident. Mild facet hypertrophy is noted bilaterally.  L3-4: Chronic loss of disc height is noted. Broad-based disc protrusion is evident. The disc protrusion is asymmetric to the left. Mild left subarticular and progressive moderate left  foraminal stenosis is present. Mild right foraminal narrowing is similar to the prior exam.  L4-5: Left laminectomy is noted. Mild facet hypertrophy is noted bilaterally. Central canal is patent. Moderate left foraminal stenosis is again seen. Mild right foraminal narrowing is stable.  L5-S1: Negative.  IMPRESSION: 1. Transitional anatomy. The L5 vertebral body is partially incorporated into the sacrum. 2. Mild foraminal narrowing bilaterally at L1-2 has progressed. 3. Mild left subarticular and progressive moderate left foraminal stenosis at L3-4 secondary to a leftward disc protrusion. 4. Mild right foraminal narrowing at L3-4 is similar to the prior exam. 5. Moderate left and mild right foraminal stenosis at L4-5 is stable.  Electronically Signed   By: San Morelle M.D.   On: 03/15/2020 08:19 Note: Reviewed        Physical Exam  General appearance: Well nourished, well developed, and well hydrated. In  no apparent acute distress Mental status: Alert, oriented x 3 (person, place, & time)       Respiratory: No evidence of acute respiratory distress Eyes: PERLA Vitals: BP 108/84   Pulse 63   Temp (!) 97.3 F (36.3 C)   Resp 18   Ht _0  (1.626 m)   Wt 118 lb (53.5 kg)   SpO2 100%   BMI 20.25 kg/m  BMI: Estimated body mass index is 20.25 kg/m as calculated from the following:   Height as of this encounter: _1  (1.626 m).   Weight as of this encounter: 118 lb (53.5 kg). Ideal: Ideal body weight: 54.7 kg (120 lb 9.5 oz)   Lumbar Spine Area Exam  Skin & Axial Inspection: Well healed scar from previous spine surgery detected Alignment: Symmetrical Functional ROM: Pain restricted ROM affecting both sides Stability: No instability detected Muscle Tone/Strength: Functionally intact. No obvious neuro-muscular anomalies detected. Sensory (Neurological): Dermatomal pain pattern  Provocative Tests: Hyperextension/rotation test: deferred today       Lumbar  quadrant test (Kemp's test): deferred today       Lateral bending test: (+) ipsilateral radicular pain, bilaterally. Positive for bilateral foraminal stenosis. Patrick's Maneuver: deferred today                   FABER* test: deferred today                   S-I anterior distraction/compression test: deferred today         S-I lateral compression test: deferred today         S-I Thigh-thrust test: deferred today         S-I Gaenslen's test: deferred today         *(Flexion, ABduction and External Rotation) Gait & Posture Assessment  Ambulation: Limited Gait: Antalgic Posture: Difficulty standing up straight, due to pain  Lower Extremity Exam    Side: Right lower extremity  Side: Left lower extremity  Stability: No instability observed          Stability: No instability observed          Skin & Extremity Inspection: Skin color, temperature, and hair growth are WNL. No peripheral edema or cyanosis. No masses, redness, swelling, asymmetry, or associated skin lesions. No contractures.  Skin & Extremity Inspection: Skin color, temperature, and hair growth are WNL. No peripheral edema or cyanosis. No masses, redness, swelling, asymmetry, or associated skin lesions. No contractures.  Functional ROM: Pain restricted ROM         Limited SLR (straight leg raise)  Functional ROM: Pain restricted ROM         Limited SLR (straight leg raise)  Muscle Tone/Strength: Functionally intact. No obvious neuro-muscular anomalies detected.  Muscle Tone/Strength: Functionally intact. No obvious neuro-muscular anomalies detected.  Sensory (Neurological): Dermatomal pain pattern        Sensory (Neurological): Dermatomal pain pattern        DTR: Patellar: deferred today Achilles: deferred today Plantar: deferred today  DTR: Patellar: deferred today Achilles: deferred today Plantar: deferred today  Palpation: No palpable anomalies  Palpation: No palpable anomalies    Assessment   Status Diagnosis   Persistent Persistent Persistent 1. Lumbar radicular pain   2. Lumbar radiculopathy   3. Post laminectomy syndrome   4. History of lumbar surgery left L3-L4 laminotomy and microdiscectomy October 2018   5. Bilateral hip pain   6. SI joint arthritis   7. Postherpetic neuralgia (shingles Oct 2019  Left L4/5 dermatome)   8. Chronic pain syndrome      Updated Problems: Problem  Lumbar Radiculopathy  Lumbar Facet Arthropathy  History of lumbar surgery left L3-L4 laminotomy and microdiscectomy October 2018  Postherpetic neuralgia (shingles Oct 2019 Left L4/5 dermatome)  Neuropathic Pain  Post Laminectomy Syndrome  Chronic Pain Syndrome  Lumbar Disc Herniation    Plan of Care   [name] has a current medication list which includes the following long-term medication(s): gabapentin, hydrocodone-acetaminophen, [START ON 06/29/2020] hydrocodone-acetaminophen, and [START ON 07/29/2020] hydrocodone-acetaminophen.  Pharmacotherapy (Medications Ordered): Meds ordered this encounter  Medications  . HYDROcodone-acetaminophen (NORCO) 7.5-325 MG tablet    Sig: Take 1 tablet by mouth every 8 (eight) hours as needed for moderate pain or severe pain. For chronic pain syndrome    Dispense:  90 tablet    Refill:  0  . HYDROcodone-acetaminophen (NORCO) 7.5-325 MG tablet    Sig: Take 1 tablet by mouth every 8 (eight) hours as needed for moderate pain or severe pain. For chronic pain syndrome    Dispense:  90 tablet    Refill:  0  . HYDROcodone-acetaminophen (NORCO) 7.5-325 MG tablet    Sig: Take 1 tablet by mouth every 8 (eight) hours as needed for moderate pain or severe pain. For chronic pain syndrome    Dispense:  90 tablet    Refill:  0   Orders:  Orders Placed This Encounter  Procedures  . Tox[employer]SURE Select 13 (MW), Urine    Volume: 30 ml(s). Minimum 3 ml of urine is needed. Document temperature of fresh sample. Indications: Long term (current) use of opiate analgesic [phone])     Order Specific Question:   Release to patient    Answer:   Immediate   Follow-up plan:   Return in about 3 months (around 08/30/2020) for Medication Management, in person.   Recent Visits No visits were found meeting these conditions. Showing recent visits within past 90 days and meeting all other requirements Today's Visits Date Type Provider Dept  05/30/20 Office Visit Gillis Santa, MD [facility]  Showing today's visits and meeting all other requirements Future Appointments Date Type Provider Dept  08/21/20 Appointment Gillis Santa, MD [facility]  Showing future appointments within next 90 days and meeting all other requirements  I discussed the assessment and treatment plan with the patient. The patient was provided an opportunity to ask questions and all were answered. The patient agreed with the plan and demonstrated an understanding of the instructions.  Patient advised to call back or seek an in-person evaluation if the symptoms or condition worsens.  Duration of encounter: 46mnutes.  Note by: BGillis Santa MD Date: 05/30/2020; Time: 8:52 AM

## 2020-05-30 NOTE — Progress Notes (Signed)
Nursing Pain Medication Assessment:  Safety precautions to be maintained throughout the outpatient stay will include: orient to surroundings, keep bed in low position, maintain call bell within reach at all times, provide assistance with transfer out of bed and ambulation.  Medication Inspection Compliance: Dana Goodwin did not comply with our request to bring her pills to be counted. She was reminded that bringing the medication bottles, even when empty, is a requirement.  Medication: None brought in. Pill/Patch Count: None available to be counted. Bottle Appearance: No container available. Did not bring bottle(s) to appointment. Filled Date: N/A Last Medication intake:  Yesterday  Reminded to bring pills/empty bottle to appointment

## 2020-06-03 LAB — TOXASSURE SELECT 13 (MW), URINE

## 2020-08-21 ENCOUNTER — Ambulatory Visit
Payer: Medicaid Other | Attending: Student in an Organized Health Care Education/Training Program | Admitting: Student in an Organized Health Care Education/Training Program

## 2020-08-21 ENCOUNTER — Other Ambulatory Visit: Payer: Self-pay

## 2020-08-21 ENCOUNTER — Encounter: Payer: Self-pay | Admitting: Student in an Organized Health Care Education/Training Program

## 2020-08-21 DIAGNOSIS — M5416 Radiculopathy, lumbar region: Secondary | ICD-10-CM

## 2020-08-21 DIAGNOSIS — Z9889 Other specified postprocedural states: Secondary | ICD-10-CM

## 2020-08-21 DIAGNOSIS — M25552 Pain in left hip: Secondary | ICD-10-CM

## 2020-08-21 DIAGNOSIS — M25551 Pain in right hip: Secondary | ICD-10-CM

## 2020-08-21 DIAGNOSIS — G894 Chronic pain syndrome: Secondary | ICD-10-CM

## 2020-08-21 DIAGNOSIS — M961 Postlaminectomy syndrome, not elsewhere classified: Secondary | ICD-10-CM | POA: Diagnosis not present

## 2020-08-21 MED ORDER — HYDROCODONE-ACETAMINOPHEN 7.5-325 MG PO TABS: 1.0000 | ORAL_TABLET | Freq: Three times a day (TID) | ORAL | 0 refills | Status: DC | PRN
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
## 2020-08-27 ENCOUNTER — Encounter: Payer: Medicaid Other | Admitting: Student in an Organized Health Care Education/Training Program

## 2020-11-21 ENCOUNTER — Encounter: Payer: Self-pay | Admitting: Student in an Organized Health Care Education/Training Program

## 2020-11-21 ENCOUNTER — Other Ambulatory Visit: Payer: Self-pay

## 2020-11-21 ENCOUNTER — Ambulatory Visit
Payer: Medicaid Other | Attending: Student in an Organized Health Care Education/Training Program | Admitting: Student in an Organized Health Care Education/Training Program

## 2020-11-21 DIAGNOSIS — M25552 Pain in left hip: Secondary | ICD-10-CM

## 2020-11-21 DIAGNOSIS — Z9889 Other specified postprocedural states: Secondary | ICD-10-CM | POA: Diagnosis not present

## 2020-11-21 DIAGNOSIS — M5416 Radiculopathy, lumbar region: Secondary | ICD-10-CM

## 2020-11-21 DIAGNOSIS — M25551 Pain in right hip: Secondary | ICD-10-CM | POA: Diagnosis not present

## 2020-11-21 DIAGNOSIS — M961 Postlaminectomy syndrome, not elsewhere classified: Secondary | ICD-10-CM | POA: Diagnosis not present

## 2020-11-21 DIAGNOSIS — G894 Chronic pain syndrome: Secondary | ICD-10-CM
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
## 2020-12-02 ENCOUNTER — Telehealth: Payer: Self-pay

## 2020-12-02 NOTE — Telephone Encounter (Signed)
Pt says that was told to take a Rx for hydrocodone that is from 11/21 since she is out of her Oxy until her appt on 3/23.  She is unsure if that is allowed.

## 2020-12-02 NOTE — Telephone Encounter (Signed)
Per patient and Dr. Elwyn Lade note it is OK for patient to fill Hydrocodone script. Pharmacy and patient called and informed.

## 2020-12-25 ENCOUNTER — Encounter: Payer: Self-pay | Admitting: Student in an Organized Health Care Education/Training Program

## 2020-12-25 ENCOUNTER — Other Ambulatory Visit: Payer: Self-pay

## 2020-12-25 ENCOUNTER — Ambulatory Visit
Payer: Medicaid Other | Attending: Student in an Organized Health Care Education/Training Program | Admitting: Student in an Organized Health Care Education/Training Program

## 2020-12-25 DIAGNOSIS — T07XXXA Unspecified multiple injuries, initial encounter: Secondary | ICD-10-CM | POA: Insufficient documentation

## 2020-12-25 DIAGNOSIS — M5116 Intervertebral disc disorders with radiculopathy, lumbar region: Secondary | ICD-10-CM | POA: Diagnosis not present

## 2020-12-25 DIAGNOSIS — M5416 Radiculopathy, lumbar region: Secondary | ICD-10-CM | POA: Diagnosis not present

## 2020-12-25 DIAGNOSIS — Z8249 Family history of ischemic heart disease and other diseases of the circulatory system: Secondary | ICD-10-CM | POA: Insufficient documentation

## 2020-12-25 DIAGNOSIS — M25551 Pain in right hip: Secondary | ICD-10-CM | POA: Insufficient documentation

## 2020-12-25 DIAGNOSIS — G894 Chronic pain syndrome: Secondary | ICD-10-CM | POA: Insufficient documentation

## 2020-12-25 DIAGNOSIS — Z79899 Other long term (current) drug therapy: Secondary | ICD-10-CM | POA: Insufficient documentation

## 2020-12-25 DIAGNOSIS — M25552 Pain in left hip: Secondary | ICD-10-CM | POA: Insufficient documentation

## 2020-12-25 DIAGNOSIS — Z87891 Personal history of nicotine dependence: Secondary | ICD-10-CM | POA: Insufficient documentation

## 2020-12-25 DIAGNOSIS — Z791 Long term (current) use of non-steroidal anti-inflammatories (NSAID): Secondary | ICD-10-CM | POA: Diagnosis not present

## 2020-12-25 DIAGNOSIS — Z9889 Other specified postprocedural states: Secondary | ICD-10-CM

## 2020-12-25 DIAGNOSIS — Z8719 Personal history of other diseases of the digestive system: Secondary | ICD-10-CM | POA: Insufficient documentation

## 2020-12-25 DIAGNOSIS — M961 Postlaminectomy syndrome, not elsewhere classified: Secondary | ICD-10-CM | POA: Diagnosis not present

## 2020-12-25 MED ORDER — OXYCODONE-ACETAMINOPHEN 10-325 MG PO TABS: 1.0000 | ORAL_TABLET | Freq: Four times a day (QID) | ORAL | 0 refills | Status: DC | PRN

## 2020-12-25 MED ORDER — OXYCODONE-ACETAMINOPHEN 10-325 MG PO TABS: 1.0000 | ORAL_TABLET | Freq: Four times a day (QID) | ORAL | 0 refills | Status: AC | PRN

## 2020-12-25 MED ORDER — ACETAMINOPHEN 500 MG PO TABS: 500.0000 mg | ORAL_TABLET | Freq: Two times a day (BID) | ORAL | 2 refills | Status: AC | PRN

## 2020-12-25 NOTE — Progress Notes (Signed)
Nursing Pain Medication Assessment:  Safety precautions to be maintained throughout the outpatient stay will include: orient to surroundings, keep bed in low position, maintain call bell within reach at all times, provide assistance with transfer out of bed and ambulation.  Medication Inspection Compliance: Pill count conducted under aseptic conditions, in front of the patient. Neither the pills nor the bottle was removed from the patient's sight at any time. Once count was completed pills were immediately returned to the patient in their original bottle.  Medication #1: Hydrocodone/APAP Pill/Patch Count: 20 of 90 pills remain Pill/Patch Appearance: Markings consistent with prescribed medication Bottle Appearance: Standard pharmacy container. Clearly labeled. Filled Date: 12/02/2020 Last Medication intake:  Yesterday  Medication #2: Oxycodone IR Pill/Patch Count: 2 of 90 pills remain Pill/Patch Appearance: Markings consistent with prescribed medication Bottle Appearance: Standard pharmacy container. Clearly labeled. Filled Date: 11/20/2020 Last Medication intake:  Today
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
## 2021-02-05 ENCOUNTER — Other Ambulatory Visit: Payer: Self-pay

## 2021-02-05 ENCOUNTER — Emergency Department (HOSPITAL_COMMUNITY)
Admission: EM | Admit: 2021-02-05 | Discharge: 2021-02-05 | Disposition: A | Discharge status: Home / Self Care | Payer: Medicaid Other | Attending: Emergency Medicine | Admitting: Emergency Medicine

## 2021-02-05 ENCOUNTER — Encounter (HOSPITAL_COMMUNITY): Payer: Self-pay

## 2021-02-05 ENCOUNTER — Emergency Department (HOSPITAL_COMMUNITY): Payer: Medicaid Other

## 2021-02-05 DIAGNOSIS — R519 Headache, unspecified: Secondary | ICD-10-CM | POA: Diagnosis present

## 2021-02-05 DIAGNOSIS — Z20822 Contact with and (suspected) exposure to covid-19: Secondary | ICD-10-CM | POA: Diagnosis not present

## 2021-02-05 DIAGNOSIS — J069 Acute upper respiratory infection, unspecified: Secondary | ICD-10-CM | POA: Insufficient documentation

## 2021-02-05 DIAGNOSIS — Z87891 Personal history of nicotine dependence: Secondary | ICD-10-CM | POA: Diagnosis not present

## 2021-02-05 LAB — BASIC METABOLIC PANEL
Anion gap: 5 (ref 5–15)
BUN: 15 mg/dL (ref 6–20)
CO2: 30 mmol/L (ref 22–32)
Calcium: 9.2 mg/dL (ref 8.9–10.3)
Chloride: 101 mmol/L (ref 98–111)
Creatinine, Ser: 0.55 mg/dL (ref 0.44–1.00)
GFR, Estimated: >60 mL/min (ref >60)
Glucose, Bld: 121 mg/dL — ABNORMAL HIGH (ref 70–99)
Potassium: 3.9 mmol/L (ref 3.5–5.1)
Sodium: 136 mmol/L (ref 135–145)

## 2021-02-05 LAB — CBC WITH DIFFERENTIAL/PLATELET
Abs Immature Granulocytes: 0.03 K/uL (ref 0.00–0.07)
Basophils Absolute: 0.1 K/uL (ref 0.0–0.1)
Basophils Relative: 1 %
Eosinophils Absolute: 0.1 K/uL (ref 0.0–0.5)
Eosinophils Relative: 1 %
HCT: 39.7 % (ref 36.0–46.0)
Hemoglobin: 13.1 g/dL (ref 12.0–15.0)
Immature Granulocytes: 0 %
Lymphocytes Relative: 19 %
Lymphs Abs: 1.6 K/uL (ref 0.7–4.0)
MCH: 30.5 pg (ref 26.0–34.0)
MCHC: 33 g/dL (ref 30.0–36.0)
MCV: 92.5 fL (ref 80.0–100.0)
Monocytes Absolute: 0.6 K/uL (ref 0.1–1.0)
Monocytes Relative: 7 %
Neutro Abs: 5.9 K/uL (ref 1.7–7.7)
Neutrophils Relative %: 72 %
Platelets: 449 K/uL — ABNORMAL HIGH (ref 150–400)
RBC: 4.29 MIL/uL (ref 3.87–5.11)
RDW: 13.2 % (ref 11.5–15.5)
WBC: 8.2 K/uL (ref 4.0–10.5)
nRBC: 0 % (ref 0.0–0.2)

## 2021-02-05 LAB — RESP PANEL BY RT-PCR (FLU A&B, COVID) ARPGX2
Influenza A by PCR: NEGATIVE
Influenza B by PCR: NEGATIVE
SARS Coronavirus 2 by RT PCR: NEGATIVE

## 2021-02-05 MED ORDER — AMOXICILLIN-POT CLAVULANATE 875-125 MG PO TABS: 1.0000 | ORAL_TABLET | Freq: Two times a day (BID) | ORAL | 0 refills | Status: AC

## 2021-02-05 NOTE — ED Triage Notes (Signed)
Pt to er, pt states that she has fever and chills, states that she coughs up some green stuff, states that she took some abx, but she isn't getting any better, states that she has been sick since the 19th of April, states that she has taken two at home covid tests and they were both negative.

## 2021-02-05 NOTE — ED Provider Notes (Signed)
[facility] EMERGENCY DEPARTMENT Provider Note   CSN: [id number] Arrival date & time: 02/05/21  1224     History Chief Complaint  Patient presents with  . Cough  . Weakness    Dana Goodwin is a 60 y.o. female.  HPI   Patient with significant medical history of chronic back pain, sciatica presents to the emergency department with URI-like symptoms.  Patient states this started 1 week ago last Sunday, she woke up with headaches, fevers, chills, nasal congestion, productive cough and general body aches.  She denies nausea, vomiting, diarrhea, abdominal pain, tolerating p.o. without any difficulty.  She denies recent sick contacts, has gotten her COVID-vaccine but has not gotten her influenza vaccine, she is not immunocompromised.  Patient denies alleviating factors.  Patient denies chest pain, shortness of breath, abdominal pain, nausea, vomiting, diarrhea, worsening pedal edema.  Past Medical History:  Diagnosis Date  . Chronic back pain   . History of colon polyps    benign  . Numbness    both leg   . Sciatica   . Urinary frequency     Patient Active Problem List   Diagnosis Date Noted  . Bilateral hip pain 08/21/2020  . Lumbar radicular pain 07/06/2019  . Lumbar facet arthropathy 11/17/2018  . History of lumbar surgery left L3-L4 laminotomy and microdiscectomy October 2018 08/18/2018  . Postherpetic neuralgia (shingles Oct 2019 Left L4/5 dermatome) 08/18/2018  . Neuropathic pain 08/18/2018  . Post laminectomy syndrome 08/18/2018  . Chronic pain syndrome 08/18/2018  . 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 Special screening for malignant neoplasms, colon     Past Surgical History:  Procedure Laterality Date  . BACK SURGERY     x 2  . COLONOSCOPY N/A 06/19/2016   Procedure: COLONOSCOPY;  Surgeon: Danie Binder, MD;  Location: [facility];  Service: Endoscopy;  Laterality: N/A;  9:15 Am  . FRACTURE SURGERY Left    left leg has rod and pins  . LUMBAR  LAMINECTOMY/DECOMPRESSION MICRODISCECTOMY Left 12/30/2016   Procedure: Left Lumbar Three-Four Microdiscectomy;  Surgeon: Earnie Larsson, MD;  Location: [facility];  Service: Neurosurgery;  Laterality: Left;     OB History   No obstetric history on file.     Family History  Problem Relation Age of Onset  . Heart attack Mother   . Prostate cancer Father   . Heart attack Brother   . Diabetes Brother     Social History   Tobacco Use  . Smoking status: Former Smoker    Packs/day: 0.50    Years: 37.00    Pack years: 18.50    Types: Cigarettes    Quit date: 08/16/2020    Years since quitting: 0.4  . Smokeless tobacco: Never Used  Vaping Use  . Vaping Use: Former  Substance Use Topics  . Alcohol use: No  . Drug use: No    Home Medications Prior to Admission medications   Medication Sig Start Date End Date Taking? Authorizing Provider  amoxicillin-clavulanate (AUGMENTIN) 875-125 MG tablet Take 1 tablet by mouth every 12 (twelve) hours for 7 days. 02/05/21 02/12/21 Yes Marcello Fennel, PA-C  acetaminophen (TYLENOL) 500 MG tablet Take 1 tablet (500 mg total) by mouth 2 (two) times daily as needed for moderate pain. 12/25/20 03/25/21  Gillis Santa, MD  gabapentin (NEURONTIN) 300 MG capsule Take 1 capsule (300 mg total) by mouth 2 (two) times daily as needed. 07/06/19 07/05/20  Gillis Santa, MD  HYDROcodone-acetaminophen (NORCO) 7.5-325 MG tablet Take  1 tablet by mouth every 8 (eight) hours as needed for moderate pain or severe pain. For chronic pain syndrome 08/28/20 09/27/20  Edward Jolly, MD  ibuprofen (ADVIL,MOTRIN) 600 MG tablet Take 1 tablet (600 mg total) by mouth every 6 (six) hours as needed. 10/20/17   Burgess Amor, PA-C  Multiple Vitamin (QUINTABS) TABS Take 1 tablet by mouth daily. 09/06/20 09/06/21  [provider]  Multiple Vitamins-Minerals (MULTIVITAMIN WITH MINERALS) tablet Take 1 tablet by mouth daily.    [provider]  oxyCODONE-acetaminophen (PERCOCET) 10-325  MG tablet Take 1 tablet by mouth every 6 (six) hours as needed for pain. Must last 30 days. 01/30/21 03/01/21  Edward Jolly, MD    Allergies    No known allergies  Review of Systems   Review of Systems  Constitutional: Positive for chills and fever.  HENT: Positive for congestion. Negative for ear pain and sore throat.   Respiratory: Positive for cough. Negative for shortness of breath.   Cardiovascular: Negative for chest pain.  Gastrointestinal: Negative for abdominal pain, diarrhea, nausea and vomiting.  Genitourinary: Negative for enuresis.  Musculoskeletal: Positive for myalgias. Negative for back pain.  Skin: Negative for rash.  Neurological: Positive for headaches. Negative for dizziness.  Hematological: Does not bruise/bleed easily.    Physical Exam Updated Vital Signs BP 127/68   Pulse 84   Temp 99.1 F (37.3 C) (Oral)   Resp 16   Ht 5\' 4"  (1.626 m)   Wt 54.9 kg   SpO2 93%   BMI 20.77 kg/m   Physical Exam Vitals and nursing note reviewed.  Constitutional:      General: She is not in acute distress.    Appearance: She is not ill-appearing.  HENT:     Head: Normocephalic and atraumatic.     Right Ear: Tympanic membrane, ear canal and external ear normal.     Left Ear: Tympanic membrane, ear canal and external ear normal.     Nose: Congestion present.     Comments: Patient has erythematous turbinates    Mouth/Throat:     Mouth: Mucous membranes are moist.     Pharynx: Oropharynx is clear. No oropharyngeal exudate or posterior oropharyngeal erythema.     Comments: Patient is noted cobblestoning the posterior pharynx Eyes:     Conjunctiva/sclera: Conjunctivae normal.  Cardiovascular:     Rate and Rhythm: Normal rate and regular rhythm.     Pulses: Normal pulses.     Heart sounds: No murmur heard. No friction rub. No gallop.   Pulmonary:     Effort: No respiratory distress.     Breath sounds: No wheezing, rhonchi or rales.  Abdominal:     Palpations:  Abdomen is soft.     Tenderness: There is no abdominal tenderness.  Musculoskeletal:     Right lower leg: No edema.     Left lower leg: No edema.  Skin:    General: Skin is warm and dry.  Neurological:     Mental Status: She is alert.  Psychiatric:        Mood and Affect: Mood normal.     ED Results / Procedures / Treatments   Labs (all labs ordered are listed, but only abnormal results are displayed) Labs Reviewed  CBC WITH DIFFERENTIAL/PLATELET - Abnormal; Notable for the following components:      Result Value   Platelets 449 (*)    All other components within normal limits  BASIC METABOLIC PANEL - Abnormal; Notable for the following components:  Glucose, Bld 121 (*)    All other components within normal limits  RESP PANEL BY RT-PCR (FLU A&B, COVID) [id number]    EKG None  Radiology DG Chest 2 View  Result Date: 02/05/2021 CLINICAL DATA:  Cough and congestion EXAM: CHEST - 2 VIEW COMPARISON:  None. FINDINGS: Hyperinflation with suspected emphysematous disease. Tiny left pleural effusion versus pleural scarring. Streaky atelectasis or minimal infiltrate medial left lung base. Normal cardiomediastinal silhouette with aortic atherosclerosis. No pneumothorax. Surgical screws in the proximal left humerus. IMPRESSION: 1. Streaky atelectasis or minimal infiltrate left lung base. 2. Hyperinflation with suspected emphysematous disease. Tiny left pleural effusion versus pleural scarring Electronically Signed   By: Donavan Foil M.D.   On: 02/05/2021 15:34    Procedures Procedures   Medications Ordered in ED Medications - No data to display  ED Course  I have reviewed the triage vital signs and the nursing notes.  Pertinent labs & imaging results that were available during my care of the patient were reviewed by me and considered in my medical decision making (see chart for details).    MDM Rules/Calculators/A&P                         Initial impression-patient presents with  URI-like symptoms.  She is alert, does not appear acute distress, vital signs reassuring.  Suspect patient some form of viral URI, will obtain basic lab work-up chest x-ray,  Work-up-CBC unremarkable, BMP shows hyperglycemia of 121, chest x-ray unremarkable.  Reassessment patient reassessed has no complaints this time, vital signs remained stable.  Patient is agreeable for discharge at this time.  Rule out- Low suspicion for systemic infection as patient is nontoxic-appearing, vital signs reassuring, no obvious source infection noted on exam.  Low suspicion for pneumonia as lung sounds are clear bilaterally, x-ray did show streaky atelectasis or minimal infiltrates left lung base, hyperinflation with suspect emphysema disease.  Possible this is the beginning of the bacterial pneumonia versus viral URI.  I have low suspicion for PE as patient denies pleuritic chest pain, shortness of breath, vital signs remained stable.  Low suspicion for strep throat as oropharynx was visualized, no erythema or exudates noted.  Low suspicion patient would need  hospitalized due to viral infection or Covid as vital signs reassuring, patient is not in respiratory distress.    Plan-  1.  URI-suspect is a viral infection cannot exclude the possibility of a bacterial infection.  Patient took a Z-Pak 1 week ago states that she was prescribed this 6 years ago and is unsure if it really helped her.  will provide her with antibiotics as this could be a possible bacterial infection recommend that she withhold it for the next 4 to 5 days is see if she gets any better.  If she use the antibiotics I I want her follow-up with the PCP for further evaluation.  Vital signs have remained stable, no indication for hospital admission.  Patient given at home care as well strict return precautions.  Patient verbalized that they understood agreed to said plan.   Final Clinical Impression(s) / ED Diagnoses Final diagnoses:  Upper  respiratory tract infection, unspecified type    Rx / DC Orders ED Discharge Orders         Ordered    amoxicillin-clavulanate (AUGMENTIN) 875-125 MG tablet  Every 12 hours        02/05/21 1808           Ileene Patrick,  Orson Ape, PA-C 02/05/21 1810    Dorie Rank, MD 02/06/21 1116

## 2021-02-05 NOTE — Discharge Instructions (Signed)
I suspect you have a viral infection but it is possible that you may have the start of a bacterial infection.  I have given you a prescription for antibiotics please use in 5 days if your symptoms do not improve.  I recommend Tylenol for fever control, ibuprofen for pain control, please stay hydrated.  Your COVID and your influenza test are pending at this time you will find results on MyChart.  Please follow-up your PCP in 1 week's time for reevaluation.  Come back to the emergency department if you develop chest pain, shortness of breath, severe abdominal pain, uncontrolled nausea, vomiting, diarrhea.

## 2021-02-13 ENCOUNTER — Encounter: Payer: Self-pay | Admitting: Student in an Organized Health Care Education/Training Program

## 2021-02-13 ENCOUNTER — Ambulatory Visit
Payer: Medicaid Other | Attending: Student in an Organized Health Care Education/Training Program | Admitting: Student in an Organized Health Care Education/Training Program

## 2021-02-13 ENCOUNTER — Other Ambulatory Visit: Payer: Self-pay

## 2021-02-13 VITALS — BP 97/50 | HR 71 | Temp 97.1°F | Resp 16 | Ht 64.0 in | Wt 121.0 lb

## 2021-02-13 DIAGNOSIS — M5116 Intervertebral disc disorders with radiculopathy, lumbar region: Secondary | ICD-10-CM | POA: Diagnosis not present

## 2021-02-13 DIAGNOSIS — Z9889 Other specified postprocedural states: Secondary | ICD-10-CM | POA: Diagnosis not present

## 2021-02-13 DIAGNOSIS — Z8249 Family history of ischemic heart disease and other diseases of the circulatory system: Secondary | ICD-10-CM | POA: Insufficient documentation

## 2021-02-13 DIAGNOSIS — Z79891 Long term (current) use of opiate analgesic: Secondary | ICD-10-CM | POA: Diagnosis not present

## 2021-02-13 DIAGNOSIS — M47816 Spondylosis without myelopathy or radiculopathy, lumbar region: Secondary | ICD-10-CM

## 2021-02-13 DIAGNOSIS — G894 Chronic pain syndrome: Secondary | ICD-10-CM

## 2021-02-13 DIAGNOSIS — R918 Other nonspecific abnormal finding of lung field: Secondary | ICD-10-CM | POA: Insufficient documentation

## 2021-02-13 DIAGNOSIS — T07XXXA Unspecified multiple injuries, initial encounter: Secondary | ICD-10-CM

## 2021-02-13 DIAGNOSIS — M4726 Other spondylosis with radiculopathy, lumbar region: Secondary | ICD-10-CM | POA: Diagnosis not present

## 2021-02-13 DIAGNOSIS — M5416 Radiculopathy, lumbar region: Secondary | ICD-10-CM

## 2021-02-13 DIAGNOSIS — T07XXXS Unspecified multiple injuries, sequela: Secondary | ICD-10-CM | POA: Insufficient documentation

## 2021-02-13 DIAGNOSIS — Z86010 Personal history of colonic polyps: Secondary | ICD-10-CM | POA: Diagnosis not present

## 2021-02-13 DIAGNOSIS — M25551 Pain in right hip: Secondary | ICD-10-CM | POA: Diagnosis not present

## 2021-02-13 DIAGNOSIS — Z79899 Other long term (current) drug therapy: Secondary | ICD-10-CM | POA: Diagnosis not present

## 2021-02-13 DIAGNOSIS — M25552 Pain in left hip: Secondary | ICD-10-CM

## 2021-02-13 DIAGNOSIS — Z87891 Personal history of nicotine dependence: Secondary | ICD-10-CM | POA: Insufficient documentation

## 2021-02-13 DIAGNOSIS — M961 Postlaminectomy syndrome, not elsewhere classified: Secondary | ICD-10-CM

## 2021-02-13 DIAGNOSIS — M792 Neuralgia and neuritis, unspecified: Secondary | ICD-10-CM | POA: Insufficient documentation

## 2021-02-13 MED ORDER — OXYCODONE-ACETAMINOPHEN 10-325 MG PO TABS: 1.0000 | ORAL_TABLET | Freq: Four times a day (QID) | ORAL | 0 refills | Status: AC | PRN

## 2021-02-13 MED ORDER — OXYCODONE-ACETAMINOPHEN 7.5-325 MG PO TABS: 1.0000 | ORAL_TABLET | Freq: Four times a day (QID) | ORAL | 0 refills | Status: DC | PRN

## 2021-02-13 MED ORDER — OXYCODONE-ACETAMINOPHEN 7.5-325 MG PO TABS: 1.0000 | ORAL_TABLET | Freq: Four times a day (QID) | ORAL | 0 refills | Status: AC | PRN

## 2021-02-13 NOTE — Progress Notes (Signed)
Nursing Pain Medication Assessment:  Safety precautions to be maintained throughout the outpatient stay will include: orient to surroundings, keep bed in low position, maintain call bell within reach at all times, provide assistance with transfer out of bed and ambulation.  Medication Inspection Compliance: Pill count conducted under aseptic conditions, in front of the patient. Neither the pills nor the bottle was removed from the patient's sight at any time. Once count was completed pills were immediately returned to the patient in their original bottle.  Medication: Oxycodone/APAP Pill/Patch Count: 62 of 120 pills remain Pill/Patch Appearance: Markings consistent with prescribed medication Bottle Appearance: Standard pharmacy container. Clearly labeled. Filled Date: 04 / 28 / 2022 Last Medication intake:  Today
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
## 2021-03-04 ENCOUNTER — Telehealth: Payer: Self-pay

## 2021-03-04 NOTE — Telephone Encounter (Signed)
Pt wanted to notify Dr.Lateef that her operation has been postponed by the ortho surgeon. That he will re-evaluate in July then schedule her knee surgery and she will give [place] a call then

## 2021-03-04 NOTE — Telephone Encounter (Signed)
FYI only.

## 2021-05-27 ENCOUNTER — Encounter: Payer: Self-pay | Admitting: Student in an Organized Health Care Education/Training Program

## 2021-05-27 ENCOUNTER — Other Ambulatory Visit: Payer: Self-pay

## 2021-05-27 ENCOUNTER — Ambulatory Visit
Payer: Medicaid Other | Attending: Student in an Organized Health Care Education/Training Program | Admitting: Student in an Organized Health Care Education/Training Program

## 2021-05-27 VITALS — BP 132/83 | HR 78 | Temp 97.3°F | Resp 16 | Ht 64.0 in | Wt 121.0 lb

## 2021-05-27 DIAGNOSIS — G894 Chronic pain syndrome: Secondary | ICD-10-CM | POA: Diagnosis present

## 2021-05-27 DIAGNOSIS — M4726 Other spondylosis with radiculopathy, lumbar region: Secondary | ICD-10-CM | POA: Diagnosis not present

## 2021-05-27 DIAGNOSIS — M25552 Pain in left hip: Secondary | ICD-10-CM | POA: Diagnosis not present

## 2021-05-27 DIAGNOSIS — M5416 Radiculopathy, lumbar region: Secondary | ICD-10-CM | POA: Diagnosis not present

## 2021-05-27 DIAGNOSIS — M25551 Pain in right hip: Secondary | ICD-10-CM | POA: Diagnosis not present

## 2021-05-27 DIAGNOSIS — T07XXXA Unspecified multiple injuries, initial encounter: Secondary | ICD-10-CM | POA: Diagnosis not present

## 2021-05-27 DIAGNOSIS — M47818 Spondylosis without myelopathy or radiculopathy, sacral and sacrococcygeal region: Secondary | ICD-10-CM | POA: Insufficient documentation

## 2021-05-27 DIAGNOSIS — M961 Postlaminectomy syndrome, not elsewhere classified: Secondary | ICD-10-CM | POA: Diagnosis not present

## 2021-05-27 DIAGNOSIS — M47816 Spondylosis without myelopathy or radiculopathy, lumbar region: Secondary | ICD-10-CM

## 2021-05-27 DIAGNOSIS — Z9889 Other specified postprocedural states: Secondary | ICD-10-CM | POA: Diagnosis not present

## 2021-05-27 DIAGNOSIS — Z87891 Personal history of nicotine dependence: Secondary | ICD-10-CM | POA: Insufficient documentation

## 2021-05-27 MED ORDER — OXYCODONE-ACETAMINOPHEN 10-325 MG PO TABS: 1.0000 | ORAL_TABLET | Freq: Four times a day (QID) | ORAL | 0 refills | Status: AC | PRN

## 2021-05-27 MED ORDER — OXYCODONE-ACETAMINOPHEN 10-325 MG PO TABS: 1.0000 | ORAL_TABLET | Freq: Four times a day (QID) | ORAL | 0 refills | Status: DC | PRN

## 2021-05-27 NOTE — Progress Notes (Signed)
Nursing Pain Medication Assessment:  Safety precautions to be maintained throughout the outpatient stay will include: orient to surroundings, keep bed in low position, maintain call bell within reach at all times, provide assistance with transfer out of bed and ambulation.  Medication Inspection Compliance: Pill count conducted under aseptic conditions, in front of the patient. Neither the pills nor the bottle was removed from the patient's sight at any time. Once count was completed pills were immediately returned to the patient in their original bottle.  Medication: Oxycodone/APAP Pill/Patch Count:  10 of 120 pills remain Pill/Patch Appearance: Markings consistent with prescribed medication Bottle Appearance: Standard pharmacy container. Clearly labeled. Filled Date: 07 / 27 / 2022 Last Medication intake:  Today
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
## 2021-06-04 LAB — TOXASSURE SELECT 13 (MW), URINE

## 2021-08-26 ENCOUNTER — Ambulatory Visit
Payer: Medicaid Other | Attending: Student in an Organized Health Care Education/Training Program | Admitting: Student in an Organized Health Care Education/Training Program

## 2021-08-26 ENCOUNTER — Other Ambulatory Visit: Payer: Self-pay

## 2021-08-26 ENCOUNTER — Encounter: Payer: Self-pay | Admitting: Student in an Organized Health Care Education/Training Program

## 2021-08-26 VITALS — BP 131/59 | HR 64 | Temp 96.2°F | Resp 15 | Ht 64.0 in | Wt 122.0 lb

## 2021-08-26 DIAGNOSIS — M961 Postlaminectomy syndrome, not elsewhere classified: Secondary | ICD-10-CM | POA: Diagnosis not present

## 2021-08-26 DIAGNOSIS — G894 Chronic pain syndrome: Secondary | ICD-10-CM | POA: Diagnosis not present

## 2021-08-26 DIAGNOSIS — M47816 Spondylosis without myelopathy or radiculopathy, lumbar region: Secondary | ICD-10-CM

## 2021-08-26 DIAGNOSIS — T07XXXA Unspecified multiple injuries, initial encounter: Secondary | ICD-10-CM | POA: Diagnosis not present

## 2021-08-26 DIAGNOSIS — M5416 Radiculopathy, lumbar region: Secondary | ICD-10-CM | POA: Diagnosis not present

## 2021-08-26 DIAGNOSIS — M47818 Spondylosis without myelopathy or radiculopathy, sacral and sacrococcygeal region: Secondary | ICD-10-CM

## 2021-08-26 DIAGNOSIS — Z9889 Other specified postprocedural states: Secondary | ICD-10-CM

## 2021-08-26 DIAGNOSIS — M25552 Pain in left hip: Secondary | ICD-10-CM

## 2021-08-26 DIAGNOSIS — M25551 Pain in right hip: Secondary | ICD-10-CM

## 2021-08-26 MED ORDER — OXYCODONE-ACETAMINOPHEN 10-325 MG PO TABS
1.0000 | ORAL_TABLET | Freq: Four times a day (QID) | ORAL | 0 refills | Status: DC | PRN
Start: 2021-10-28 — End: 2021-11-25

## 2021-08-26 MED ORDER — GABAPENTIN 300 MG PO CAPS: 300.0000 mg | ORAL_CAPSULE | Freq: Three times a day (TID) | ORAL | 2 refills | Status: DC

## 2021-08-26 MED ORDER — OXYCODONE-ACETAMINOPHEN 10-325 MG PO TABS: 1.0000 | ORAL_TABLET | Freq: Four times a day (QID) | ORAL | 0 refills | Status: AC | PRN

## 2021-08-26 NOTE — Progress Notes (Signed)
Nursing Pain Medication Assessment:  Safety precautions to be maintained throughout the outpatient stay will include: orient to surroundings, keep bed in low position, maintain call bell within reach at all times, provide assistance with transfer out of bed and ambulation.  Medication Inspection Compliance: Pill count conducted under aseptic conditions, in front of the patient. Neither the pills nor the bottle was removed from the patient's sight at any time. Once count was completed pills were immediately returned to the patient in their original bottle.  Medication: Oxycodone/APAP Pill/Patch Count:  06 of 120 pills remain Pill/Patch Appearance: Markings consistent with prescribed medication Bottle Appearance: Standard pharmacy container. Clearly labeled. Filled Date: 82 / 25 / 2022 Last Medication intake:  Today
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
## 2021-11-25 ENCOUNTER — Ambulatory Visit
Payer: Medicaid Other | Attending: Student in an Organized Health Care Education/Training Program | Admitting: Student in an Organized Health Care Education/Training Program

## 2021-11-25 ENCOUNTER — Encounter: Payer: Self-pay | Admitting: Student in an Organized Health Care Education/Training Program

## 2021-11-25 ENCOUNTER — Other Ambulatory Visit: Payer: Self-pay

## 2021-11-25 VITALS — BP 124/99 | HR 75 | Temp 98.4°F | Ht 64.0 in | Wt 122.0 lb

## 2021-11-25 DIAGNOSIS — M79604 Pain in right leg: Secondary | ICD-10-CM | POA: Insufficient documentation

## 2021-11-25 DIAGNOSIS — G894 Chronic pain syndrome: Secondary | ICD-10-CM | POA: Insufficient documentation

## 2021-11-25 DIAGNOSIS — M79602 Pain in left arm: Secondary | ICD-10-CM | POA: Insufficient documentation

## 2021-11-25 DIAGNOSIS — M25551 Pain in right hip: Secondary | ICD-10-CM | POA: Diagnosis not present

## 2021-11-25 DIAGNOSIS — M961 Postlaminectomy syndrome, not elsewhere classified: Secondary | ICD-10-CM | POA: Diagnosis not present

## 2021-11-25 DIAGNOSIS — M5416 Radiculopathy, lumbar region: Secondary | ICD-10-CM | POA: Insufficient documentation

## 2021-11-25 DIAGNOSIS — M25552 Pain in left hip: Secondary | ICD-10-CM

## 2021-11-25 DIAGNOSIS — M79605 Pain in left leg: Secondary | ICD-10-CM | POA: Insufficient documentation

## 2021-11-25 DIAGNOSIS — M79601 Pain in right arm: Secondary | ICD-10-CM | POA: Insufficient documentation

## 2021-11-25 DIAGNOSIS — M47816 Spondylosis without myelopathy or radiculopathy, lumbar region: Secondary | ICD-10-CM

## 2021-11-25 DIAGNOSIS — T07XXXA Unspecified multiple injuries, initial encounter: Secondary | ICD-10-CM

## 2021-11-25 DIAGNOSIS — M47818 Spondylosis without myelopathy or radiculopathy, sacral and sacrococcygeal region: Secondary | ICD-10-CM

## 2021-11-25 MED ORDER — OXYCODONE-ACETAMINOPHEN 10-325 MG PO TABS: 1.0000 | ORAL_TABLET | Freq: Four times a day (QID) | ORAL | 0 refills | Status: DC | PRN

## 2021-11-25 MED ORDER — OXYCODONE-ACETAMINOPHEN 10-325 MG PO TABS: 1.0000 | ORAL_TABLET | Freq: Four times a day (QID) | ORAL | 0 refills | Status: AC | PRN
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
## 2021-11-25 NOTE — Progress Notes (Signed)
Nursing Pain Medication Assessment:  Safety precautions to be maintained throughout the outpatient stay will include: orient to surroundings, keep bed in low position, maintain call bell within reach at all times, provide assistance with transfer out of bed and ambulation.  Medication Inspection Compliance: Pill count conducted under aseptic conditions, in front of the patient. Neither the pills nor the bottle was removed from the patient's sight at any time. Once count was completed pills were immediately returned to the patient in their original bottle.  Medication: Oxycodone/APAP Pill/Patch Count: 10 out of 120 Pill/Patch Appearance: Markings consistent with prescribed medication Bottle Appearance: Standard pharmacy container. Clearly labeled. Filled Date: 1 / 25 / 2023 Last Medication intake:  TodaySafety precautions to be maintained throughout the outpatient stay will include: orient to surroundings, keep bed in low position, maintain call bell within reach at all times, provide assistance with transfer out of bed and ambulation.

## 2021-11-26 ENCOUNTER — Encounter: Payer: Medicaid Other | Admitting: Student in an Organized Health Care Education/Training Program

## 2021-12-27 ENCOUNTER — Other Ambulatory Visit: Payer: Self-pay | Admitting: Student in an Organized Health Care Education/Training Program

## 2021-12-27 DIAGNOSIS — G894 Chronic pain syndrome: Secondary | ICD-10-CM

## 2022-01-01 ENCOUNTER — Other Ambulatory Visit (HOSPITAL_COMMUNITY): Payer: Self-pay | Admitting: Internal Medicine

## 2022-01-01 DIAGNOSIS — Z1231 Encounter for screening mammogram for malignant neoplasm of breast: Secondary | ICD-10-CM

## 2022-01-07 ENCOUNTER — Ambulatory Visit (HOSPITAL_COMMUNITY): Payer: Medicaid Other

## 2022-01-15 ENCOUNTER — Ambulatory Visit (HOSPITAL_COMMUNITY): Payer: Medicaid Other

## 2022-01-15 ENCOUNTER — Ambulatory Visit (HOSPITAL_COMMUNITY)
Admission: RE | Admit: 2022-01-15 | Discharge: 2022-01-15 | Disposition: A | Discharge status: Home / Self Care | Payer: Medicaid Other | Source: Ambulatory Visit | Attending: Internal Medicine | Admitting: Internal Medicine

## 2022-01-15 DIAGNOSIS — Z1231 Encounter for screening mammogram for malignant neoplasm of breast: Secondary | ICD-10-CM | POA: Diagnosis not present

## 2022-02-17 ENCOUNTER — Encounter: Payer: Self-pay | Admitting: Student in an Organized Health Care Education/Training Program

## 2022-02-17 ENCOUNTER — Other Ambulatory Visit: Payer: Self-pay

## 2022-02-17 ENCOUNTER — Ambulatory Visit
Payer: Medicaid Other | Attending: Student in an Organized Health Care Education/Training Program | Admitting: Student in an Organized Health Care Education/Training Program

## 2022-02-17 DIAGNOSIS — M47816 Spondylosis without myelopathy or radiculopathy, lumbar region: Secondary | ICD-10-CM | POA: Insufficient documentation

## 2022-02-17 DIAGNOSIS — M961 Postlaminectomy syndrome, not elsewhere classified: Secondary | ICD-10-CM | POA: Insufficient documentation

## 2022-02-17 DIAGNOSIS — M25551 Pain in right hip: Secondary | ICD-10-CM | POA: Diagnosis not present

## 2022-02-17 DIAGNOSIS — M5430 Sciatica, unspecified side: Secondary | ICD-10-CM | POA: Diagnosis not present

## 2022-02-17 DIAGNOSIS — R350 Frequency of micturition: Secondary | ICD-10-CM | POA: Diagnosis not present

## 2022-02-17 DIAGNOSIS — M79602 Pain in left arm: Secondary | ICD-10-CM | POA: Insufficient documentation

## 2022-02-17 DIAGNOSIS — G894 Chronic pain syndrome: Secondary | ICD-10-CM | POA: Diagnosis present

## 2022-02-17 DIAGNOSIS — M79601 Pain in right arm: Secondary | ICD-10-CM | POA: Diagnosis not present

## 2022-02-17 DIAGNOSIS — M25552 Pain in left hip: Secondary | ICD-10-CM | POA: Insufficient documentation

## 2022-02-17 DIAGNOSIS — Z9889 Other specified postprocedural states: Secondary | ICD-10-CM | POA: Diagnosis not present

## 2022-02-17 DIAGNOSIS — M5416 Radiculopathy, lumbar region: Secondary | ICD-10-CM | POA: Diagnosis not present

## 2022-02-17 DIAGNOSIS — F17210 Nicotine dependence, cigarettes, uncomplicated: Secondary | ICD-10-CM | POA: Insufficient documentation

## 2022-02-17 DIAGNOSIS — T07XXXS Unspecified multiple injuries, sequela: Secondary | ICD-10-CM | POA: Diagnosis not present

## 2022-02-17 DIAGNOSIS — T07XXXA Unspecified multiple injuries, initial encounter: Secondary | ICD-10-CM

## 2022-02-17 MED ORDER — OXYCODONE-ACETAMINOPHEN 10-325 MG PO TABS: 1.0000 | ORAL_TABLET | Freq: Four times a day (QID) | ORAL | 0 refills | Status: AC | PRN

## 2022-02-17 MED ORDER — OXYCODONE-ACETAMINOPHEN 10-325 MG PO TABS: 1.0000 | ORAL_TABLET | Freq: Four times a day (QID) | ORAL | 0 refills | Status: DC | PRN

## 2022-02-17 MED ORDER — GABAPENTIN 300 MG PO CAPS: 300.0000 mg | ORAL_CAPSULE | Freq: Three times a day (TID) | ORAL | 2 refills | Status: DC
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
## 2022-02-17 NOTE — Progress Notes (Signed)
Nursing Pain Medication Assessment:  ?Safety precautions to be maintained throughout the outpatient stay will include: orient to surroundings, keep bed in low position, maintain call bell within reach at all times, provide assistance with transfer out of bed and ambulation.  ?Medication Inspection Compliance: Pill count conducted under aseptic conditions, in front of the patient. Neither the pills nor the bottle was removed from the patient's sight at any time. Once count was completed pills were immediately returned to the patient in their original bottle. ? ?Medication: Oxycodone/APAP ?Pill/Patch Count:  35 of 120 pills remain ?Pill/Patch Appearance: Markings consistent with prescribed medication ?Bottle Appearance: Standard pharmacy container. Clearly labeled. ?Filled Date: 04 / 25 / 2023 ?Last Medication intake:  Today ?

## 2022-03-13 ENCOUNTER — Telehealth: Payer: Self-pay | Admitting: Student in an Organized Health Care Education/Training Program

## 2022-03-13 NOTE — Telephone Encounter (Signed)
Patient lvmail at 10:30 stating she has broken her leg again. Received a script from the hospital and wants to speak with a nurse about getting it filled. If it is for same meds as prescribed by dr Holley Raring, the pharmacy will need to be notified it is ok to fill this script. Please call patient

## 2022-03-13 NOTE — Telephone Encounter (Signed)
Returned patient phone call.  She states that she was prescribed oxycodone 5 mg for a broken leg.  She was not sure that she can fill that Rx.  I told her that it is different than what Dr Holley Raring prescribes and she should be okay to fill at pharmacy.

## 2022-04-14 ENCOUNTER — Telehealth: Payer: Self-pay | Admitting: Student in an Organized Health Care Education/Training Program

## 2022-04-14 NOTE — Telephone Encounter (Signed)
PA sent via [facility].

## 2022-04-14 NOTE — Telephone Encounter (Signed)
Patient says pharmacy told her she needs pa for medications. Please notify patient so she can pick up meds

## 2022-04-27 ENCOUNTER — Telehealth: Payer: Self-pay

## 2022-04-27 NOTE — Telephone Encounter (Signed)
Insurance company states we left out information on the prior auth request and that was why it was denied. They are going to resend the fax today.

## 2022-05-19 ENCOUNTER — Ambulatory Visit
Payer: Medicaid Other | Attending: Student in an Organized Health Care Education/Training Program | Admitting: Student in an Organized Health Care Education/Training Program

## 2022-05-19 ENCOUNTER — Encounter: Payer: Self-pay | Admitting: Student in an Organized Health Care Education/Training Program

## 2022-05-19 VITALS — BP 128/70 | HR 69 | Temp 97.4°F | Resp 16 | Ht 64.0 in | Wt 123.0 lb

## 2022-05-19 DIAGNOSIS — G894 Chronic pain syndrome: Secondary | ICD-10-CM | POA: Diagnosis present

## 2022-05-19 DIAGNOSIS — Z9889 Other specified postprocedural states: Secondary | ICD-10-CM | POA: Diagnosis not present

## 2022-05-19 DIAGNOSIS — M25551 Pain in right hip: Secondary | ICD-10-CM

## 2022-05-19 DIAGNOSIS — M5416 Radiculopathy, lumbar region: Secondary | ICD-10-CM

## 2022-05-19 DIAGNOSIS — M25552 Pain in left hip: Secondary | ICD-10-CM

## 2022-05-19 DIAGNOSIS — M961 Postlaminectomy syndrome, not elsewhere classified: Secondary | ICD-10-CM | POA: Diagnosis not present

## 2022-05-19 DIAGNOSIS — T07XXXA Unspecified multiple injuries, initial encounter: Secondary | ICD-10-CM | POA: Diagnosis not present

## 2022-05-19 MED ORDER — OXYCODONE-ACETAMINOPHEN 10-325 MG PO TABS: 1.0000 | ORAL_TABLET | Freq: Four times a day (QID) | ORAL | 0 refills | Status: DC | PRN

## 2022-05-19 MED ORDER — OXYCODONE-ACETAMINOPHEN 10-325 MG PO TABS: 1.0000 | ORAL_TABLET | Freq: Four times a day (QID) | ORAL | 0 refills | Status: AC | PRN

## 2022-05-19 NOTE — Progress Notes (Signed)
Nursing Pain Medication Assessment:  Safety precautions to be maintained throughout the outpatient stay will include: orient to surroundings, keep bed in low position, maintain call bell within reach at all times, provide assistance with transfer out of bed and ambulation.  Medication Inspection Compliance: Pill count conducted under aseptic conditions, in front of the patient. Neither the pills nor the bottle was removed from the patient's sight at any time. Once count was completed pills were immediately returned to the patient in their original bottle.  Medication: Oxycodone/APAP Pill/Patch Count:  31 of 120 pills remain Pill/Patch Appearance: Markings consistent with prescribed medication Bottle Appearance: Standard pharmacy container. Clearly labeled. Filled Date: 07 / 24 / 2023 Last Medication intake:  Today
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
## 2022-05-22 LAB — TOXASSURE SELECT 13 (MW), URINE

## 2022-08-13 ENCOUNTER — Encounter: Payer: Self-pay | Admitting: Student in an Organized Health Care Education/Training Program

## 2022-08-13 ENCOUNTER — Ambulatory Visit
Payer: Medicaid Other | Attending: Student in an Organized Health Care Education/Training Program | Admitting: Student in an Organized Health Care Education/Training Program

## 2022-08-13 VITALS — BP 118/58 | HR 74 | Temp 97.2°F | Ht 64.0 in | Wt 122.0 lb

## 2022-08-13 DIAGNOSIS — G894 Chronic pain syndrome: Secondary | ICD-10-CM

## 2022-08-13 DIAGNOSIS — M961 Postlaminectomy syndrome, not elsewhere classified: Secondary | ICD-10-CM | POA: Diagnosis not present

## 2022-08-13 DIAGNOSIS — M5416 Radiculopathy, lumbar region: Secondary | ICD-10-CM | POA: Diagnosis not present

## 2022-08-13 DIAGNOSIS — M25552 Pain in left hip: Secondary | ICD-10-CM | POA: Diagnosis not present

## 2022-08-13 DIAGNOSIS — M47816 Spondylosis without myelopathy or radiculopathy, lumbar region: Secondary | ICD-10-CM

## 2022-08-13 DIAGNOSIS — M47818 Spondylosis without myelopathy or radiculopathy, sacral and sacrococcygeal region: Secondary | ICD-10-CM | POA: Diagnosis not present

## 2022-08-13 DIAGNOSIS — M25551 Pain in right hip: Secondary | ICD-10-CM

## 2022-08-13 DIAGNOSIS — T07XXXA Unspecified multiple injuries, initial encounter: Secondary | ICD-10-CM | POA: Diagnosis not present

## 2022-08-13 DIAGNOSIS — Z9889 Other specified postprocedural states: Secondary | ICD-10-CM

## 2022-08-13 MED ORDER — GABAPENTIN 300 MG PO CAPS: 300.0000 mg | ORAL_CAPSULE | Freq: Three times a day (TID) | ORAL | 5 refills | Status: DC

## 2022-08-13 MED ORDER — OXYCODONE-ACETAMINOPHEN 10-325 MG PO TABS: 1.0000 | ORAL_TABLET | Freq: Four times a day (QID) | ORAL | 0 refills | Status: DC | PRN

## 2022-08-13 MED ORDER — OXYCODONE-ACETAMINOPHEN 10-325 MG PO TABS: 1.0000 | ORAL_TABLET | Freq: Four times a day (QID) | ORAL | 0 refills | Status: AC | PRN

## 2022-08-13 NOTE — Progress Notes (Signed)
Nursing Pain Medication Assessment:  Safety precautions to be maintained throughout the outpatient stay will include: orient to surroundings, keep bed in low position, maintain call bell within reach at all times, provide assistance with transfer out of bed and ambulation.  Medication Inspection Compliance: Pill count conducted under aseptic conditions, in front of the patient. Neither the pills nor the bottle was removed from the patient's sight at any time. Once count was completed pills were immediately returned to the patient in their original bottle.  Medication: See above Pill/Patch Count:  47 of 120 pills remain Pill/Patch Appearance: Markings consistent with prescribed medication Bottle Appearance: Standard pharmacy container. Clearly labeled. Filled Date: 42 / 21 / 2023 Last Medicati10on intake:  TodaySafety precautions to be maintained throughout the outpatient stay will include: orient to surroundings, keep bed in low position, maintain call bell within reach at all times, provide assistance with transfer out of bed and ambulation.
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
## 2022-10-23 ENCOUNTER — Telehealth: Payer: Self-pay | Admitting: Student in an Organized Health Care Education/Training Program

## 2022-10-23 NOTE — Telephone Encounter (Signed)
Mitzi Hansen at [facility] [phone] needs call back about PA and if we want him to fill for 5 days or have patient wait

## 2022-10-23 NOTE — Telephone Encounter (Signed)
Spoke with Mitzi Hansen, informed him that our office will submit a PA for Oxy/Acet. He stated that patient has already filled the prescription and paid with cash.

## 2022-11-12 ENCOUNTER — Encounter: Payer: Self-pay | Admitting: Student in an Organized Health Care Education/Training Program

## 2022-11-12 ENCOUNTER — Ambulatory Visit
Payer: Medicaid Other | Attending: Student in an Organized Health Care Education/Training Program | Admitting: Student in an Organized Health Care Education/Training Program

## 2022-11-12 VITALS — BP 106/74 | HR 71 | Temp 98.7°F | Ht 64.0 in | Wt 122.0 lb

## 2022-11-12 DIAGNOSIS — M25551 Pain in right hip: Secondary | ICD-10-CM

## 2022-11-12 DIAGNOSIS — T07XXXA Unspecified multiple injuries, initial encounter: Secondary | ICD-10-CM | POA: Insufficient documentation

## 2022-11-12 DIAGNOSIS — M25552 Pain in left hip: Secondary | ICD-10-CM | POA: Insufficient documentation

## 2022-11-12 DIAGNOSIS — Z9889 Other specified postprocedural states: Secondary | ICD-10-CM | POA: Insufficient documentation

## 2022-11-12 DIAGNOSIS — M961 Postlaminectomy syndrome, not elsewhere classified: Secondary | ICD-10-CM | POA: Diagnosis not present

## 2022-11-12 DIAGNOSIS — G894 Chronic pain syndrome: Secondary | ICD-10-CM | POA: Diagnosis not present

## 2022-11-12 DIAGNOSIS — M5416 Radiculopathy, lumbar region: Secondary | ICD-10-CM | POA: Insufficient documentation

## 2022-11-12 MED ORDER — OXYCODONE-ACETAMINOPHEN 10-325 MG PO TABS: 1.0000 | ORAL_TABLET | Freq: Four times a day (QID) | ORAL | 0 refills | Status: AC | PRN

## 2022-11-12 MED ORDER — OXYCODONE-ACETAMINOPHEN 10-325 MG PO TABS: 1.0000 | ORAL_TABLET | Freq: Four times a day (QID) | ORAL | 0 refills | Status: DC | PRN

## 2022-11-12 MED ORDER — DULOXETINE HCL 20 MG PO CPEP: ORAL_CAPSULE | ORAL | 0 refills | Status: DC

## 2022-11-12 NOTE — Progress Notes (Signed)
Nursing Pain Medication Assessment:  Safety precautions to be maintained throughout the outpatient stay will include: orient to surroundings, keep bed in low position, maintain call bell within reach at all times, provide assistance with transfer out of bed and ambulation.  Medication Inspection Compliance: Pill count conducted under aseptic conditions, in front of the patient. Neither the pills nor the bottle was removed from the patient's sight at any time. Once count was completed pills were immediately returned to the patient in their original bottle.  Medication: Oxycodone/APAP Pill/Patch Count:  38 of 120 pills remain Pill/Patch Appearance: Markings consistent with prescribed medication Bottle Appearance: Standard pharmacy container. Clearly labeled. Filled Date: 1 / 41 / 2024 Last Medication intake:  TodaySafety precautions to be maintained throughout the outpatient stay will include: orient to surroundings, keep bed in low position, maintain call bell within reach at all times, provide assistance with transfer out of bed and ambulation.
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
## 2022-11-23 ENCOUNTER — Telehealth: Payer: Self-pay | Admitting: Student in an Organized Health Care Education/Training Program

## 2022-11-23 NOTE — Telephone Encounter (Signed)
PT stated that she spoke with pharmacy was told that they need autho to fill prescription. Please give patient a call. TY

## 2022-11-23 NOTE — Telephone Encounter (Signed)
Called to [employer].  Previous PA was kicked out d/t inadequate information.  Resent today 11/23/22 with records [facility]

## 2022-12-22 ENCOUNTER — Telehealth: Payer: Self-pay

## 2022-12-22 NOTE — Telephone Encounter (Signed)
She is saying the pharmacy is telling her we didn't get prior authorization for her medicines. She thought this was straightened out last month but they are telling her the same thing again.

## 2022-12-22 NOTE — Telephone Encounter (Signed)
This information has been sent twice to her insurance company. Dena called the insurance company and they told her that it was still under review. Patient called and notified.

## 2022-12-28 ENCOUNTER — Encounter: Payer: Self-pay | Admitting: Orthopaedic Trauma

## 2022-12-28 DIAGNOSIS — S72351N Displaced comminuted fracture of shaft of right femur, subsequent encounter for open fracture type IIIA, IIIB, or IIIC with nonunion: Secondary | ICD-10-CM

## 2022-12-29 ENCOUNTER — Encounter: Payer: Self-pay | Admitting: Orthopaedic Trauma

## 2022-12-29 ENCOUNTER — Other Ambulatory Visit (HOSPITAL_COMMUNITY): Payer: Self-pay | Admitting: Orthopaedic Trauma

## 2022-12-29 DIAGNOSIS — S72351N Displaced comminuted fracture of shaft of right femur, subsequent encounter for open fracture type IIIA, IIIB, or IIIC with nonunion: Secondary | ICD-10-CM

## 2023-02-10 ENCOUNTER — Encounter (HOSPITAL_COMMUNITY): Payer: Self-pay

## 2023-02-10 ENCOUNTER — Ambulatory Visit (HOSPITAL_COMMUNITY): Admission: RE | Admit: 2023-02-10 | Payer: Medicaid Other | Source: Ambulatory Visit

## 2023-02-16 ENCOUNTER — Encounter: Payer: Self-pay | Admitting: Student in an Organized Health Care Education/Training Program

## 2023-02-16 ENCOUNTER — Ambulatory Visit
Payer: Medicaid Other | Attending: Student in an Organized Health Care Education/Training Program | Admitting: Student in an Organized Health Care Education/Training Program

## 2023-02-16 DIAGNOSIS — T07XXXA Unspecified multiple injuries, initial encounter: Secondary | ICD-10-CM | POA: Insufficient documentation

## 2023-02-16 DIAGNOSIS — Z9889 Other specified postprocedural states: Secondary | ICD-10-CM | POA: Insufficient documentation

## 2023-02-16 DIAGNOSIS — G894 Chronic pain syndrome: Secondary | ICD-10-CM | POA: Insufficient documentation

## 2023-02-16 DIAGNOSIS — Z1231 Encounter for screening mammogram for malignant neoplasm of breast: Secondary | ICD-10-CM | POA: Diagnosis not present

## 2023-02-16 DIAGNOSIS — M25511 Pain in right shoulder: Secondary | ICD-10-CM | POA: Insufficient documentation

## 2023-02-16 DIAGNOSIS — M25562 Pain in left knee: Secondary | ICD-10-CM | POA: Diagnosis not present

## 2023-02-16 DIAGNOSIS — M25512 Pain in left shoulder: Secondary | ICD-10-CM | POA: Insufficient documentation

## 2023-02-16 DIAGNOSIS — M961 Postlaminectomy syndrome, not elsewhere classified: Secondary | ICD-10-CM | POA: Diagnosis not present

## 2023-02-16 DIAGNOSIS — M25561 Pain in right knee: Secondary | ICD-10-CM | POA: Diagnosis not present

## 2023-02-16 MED ORDER — OXYCODONE-ACETAMINOPHEN 10-325 MG PO TABS: 1.0000 | ORAL_TABLET | Freq: Four times a day (QID) | ORAL | 0 refills | Status: DC | PRN

## 2023-02-16 MED ORDER — OXYCODONE-ACETAMINOPHEN 10-325 MG PO TABS: 1.0000 | ORAL_TABLET | Freq: Four times a day (QID) | ORAL | 0 refills | Status: AC | PRN
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
## 2023-02-16 NOTE — Progress Notes (Signed)
Nursing Pain Medication Assessment:  Safety precautions to be maintained throughout the outpatient stay will include: orient to surroundings, keep bed in low position, maintain call bell within reach at all times, provide assistance with transfer out of bed and ambulation.  Medication Inspection Compliance: Pill count conducted under aseptic conditions, in front of the patient. Neither the pills nor the bottle was removed from the patient's sight at any time. Once count was completed pills were immediately returned to the patient in their original bottle.  Medication: Oxycodone/APAP Pill/Patch Count:  14 of 120 pills remain Pill/Patch Appearance: Markings consistent with prescribed medication Bottle Appearance: Standard pharmacy container. Clearly labeled. Filled Date: 4 / 101 / 2024 Last Medication intake:  Today

## 2023-04-21 ENCOUNTER — Ambulatory Visit (HOSPITAL_COMMUNITY): Payer: Medicaid Other

## 2023-04-21 ENCOUNTER — Encounter (HOSPITAL_COMMUNITY): Payer: Self-pay

## 2023-05-01 ENCOUNTER — Ambulatory Visit (HOSPITAL_BASED_OUTPATIENT_CLINIC_OR_DEPARTMENT_OTHER): Payer: Medicaid Other

## 2023-05-01 ENCOUNTER — Other Ambulatory Visit (HOSPITAL_BASED_OUTPATIENT_CLINIC_OR_DEPARTMENT_OTHER): Payer: Medicaid Other

## 2023-05-20 ENCOUNTER — Ambulatory Visit
Payer: Medicaid Other | Attending: Student in an Organized Health Care Education/Training Program | Admitting: Student in an Organized Health Care Education/Training Program

## 2023-05-20 ENCOUNTER — Encounter: Payer: Self-pay | Admitting: Student in an Organized Health Care Education/Training Program

## 2023-05-20 VITALS — BP 109/57 | HR 90 | Temp 98.1°F | Resp 16 | Ht 64.0 in | Wt 120.0 lb

## 2023-05-20 DIAGNOSIS — M79661 Pain in right lower leg: Secondary | ICD-10-CM | POA: Insufficient documentation

## 2023-05-20 DIAGNOSIS — M25512 Pain in left shoulder: Secondary | ICD-10-CM | POA: Insufficient documentation

## 2023-05-20 DIAGNOSIS — F17210 Nicotine dependence, cigarettes, uncomplicated: Secondary | ICD-10-CM | POA: Diagnosis not present

## 2023-05-20 DIAGNOSIS — G894 Chronic pain syndrome: Secondary | ICD-10-CM | POA: Insufficient documentation

## 2023-05-20 DIAGNOSIS — Z9889 Other specified postprocedural states: Secondary | ICD-10-CM | POA: Insufficient documentation

## 2023-05-20 DIAGNOSIS — T07XXXA Unspecified multiple injuries, initial encounter: Secondary | ICD-10-CM

## 2023-05-20 DIAGNOSIS — M961 Postlaminectomy syndrome, not elsewhere classified: Secondary | ICD-10-CM

## 2023-05-20 MED ORDER — OXYCODONE-ACETAMINOPHEN 10-325 MG PO TABS: 1.0000 | ORAL_TABLET | Freq: Four times a day (QID) | ORAL | 0 refills | Status: AC | PRN

## 2023-05-20 MED ORDER — OXYCODONE-ACETAMINOPHEN 10-325 MG PO TABS: 1.0000 | ORAL_TABLET | Freq: Four times a day (QID) | ORAL | 0 refills | Status: DC | PRN
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
## 2023-05-20 NOTE — Progress Notes (Signed)
Nursing Pain Medication Assessment:  Safety precautions to be maintained throughout the outpatient stay will include: orient to surroundings, keep bed in low position, maintain call bell within reach at all times, provide assistance with transfer out of bed and ambulation.  Medication Inspection Compliance: Pill count conducted under aseptic conditions, in front of the patient. Neither the pills nor the bottle was removed from the patient's sight at any time. Once count was completed pills were immediately returned to the patient in their original bottle.  Medication: Oxycodone/APAP Pill/Patch Count:  2 of 120 pills remain Pill/Patch Appearance: Markings consistent with prescribed medication Bottle Appearance: Standard pharmacy container. Clearly labeled. Filled Date: 07 / 17 / 2024 Last Medication intake:  Today

## 2023-05-26 LAB — TOXASSURE SELECT 13 (MW), URINE

## 2023-07-16 ENCOUNTER — Telehealth: Payer: Self-pay | Admitting: Student in an Organized Health Care Education/Training Program

## 2023-07-16 NOTE — Telephone Encounter (Signed)
PT called stated that her insurance need a PA to be send in for her Oxycodone prescription. Fax number is [phone] TY

## 2023-07-16 NOTE — Telephone Encounter (Signed)
PA sent via [employer]

## 2023-08-09 ENCOUNTER — Emergency Department (HOSPITAL_COMMUNITY): Payer: Medicaid Other

## 2023-08-09 ENCOUNTER — Other Ambulatory Visit: Payer: Self-pay

## 2023-08-09 ENCOUNTER — Encounter (HOSPITAL_COMMUNITY): Payer: Self-pay

## 2023-08-09 ENCOUNTER — Emergency Department (HOSPITAL_COMMUNITY)
Admission: EM | Admit: 2023-08-09 | Discharge: 2023-08-09 | Disposition: A | Discharge status: Home / Self Care | Payer: Medicaid Other | Attending: Emergency Medicine | Admitting: Emergency Medicine

## 2023-08-09 DIAGNOSIS — R059 Cough, unspecified: Secondary | ICD-10-CM | POA: Diagnosis present

## 2023-08-09 DIAGNOSIS — Z1152 Encounter for screening for COVID-19: Secondary | ICD-10-CM | POA: Insufficient documentation

## 2023-08-09 DIAGNOSIS — J40 Bronchitis, not specified as acute or chronic: Secondary | ICD-10-CM

## 2023-08-09 DIAGNOSIS — Z79899 Other long term (current) drug therapy: Secondary | ICD-10-CM | POA: Diagnosis not present

## 2023-08-09 DIAGNOSIS — F17200 Nicotine dependence, unspecified, uncomplicated: Secondary | ICD-10-CM | POA: Diagnosis not present

## 2023-08-09 LAB — RESP PANEL BY RT-PCR (RSV, FLU A&B, COVID)  RVPGX2
Influenza A by PCR: NEGATIVE
Influenza B by PCR: NEGATIVE
Resp Syncytial Virus by PCR: NEGATIVE
SARS Coronavirus 2 by RT PCR: NEGATIVE

## 2023-08-09 MED ORDER — DOXYCYCLINE HYCLATE 100 MG PO CAPS: 100.0000 mg | ORAL_CAPSULE | Freq: Two times a day (BID) | ORAL | 0 refills | Status: DC

## 2023-08-09 MED ORDER — DOXYCYCLINE HYCLATE 100 MG PO TABS
100.0000 mg | ORAL_TABLET | Freq: Once | ORAL | Status: AC
  Administered 2023-08-09: 100 mg via ORAL
  Filled 2023-08-09: qty 1

## 2023-08-09 NOTE — Discharge Instructions (Signed)
Take the antibiotic as prescribed. Push fluids. Continue Tylenol for aches and if any fever develops. Try to reduce smoking as much as possible.   Follow up with your doctor for recheck if no better in 3-4 days.

## 2023-08-09 NOTE — ED Triage Notes (Signed)
Pt to er, pt states that she has a runny nose, cough and headache.  Pt states that she has a cold, states that she has been ill for the past three days.

## 2023-08-09 NOTE — ED Provider Notes (Signed)
[place] EMERGENCY DEPARTMENT AT [facility] Provider Note   CSN: [id number] Arrival date & time: 08/09/23  1549     History  Chief Complaint  Patient presents with   Cough    Dana Goodwin is a 62 y.o. female.  Patient to ED for evaluation of productive cough x 3 days. She has had a mild headache and is feeling generally unwell. Is unsure if she is running a fever as she is taking Tylenol regularly. No vomiting or diarrhea. No chest pain. She is a smoker.   The history is provided by the patient. No language interpreter was used.  Cough      Home Medications Prior to Admission medications   Medication Sig Start Date End Date Taking? Authorizing Provider  doxycycline (VIBRAMYCIN) 100 MG capsule Take 1 capsule (100 mg total) by mouth 2 (two) times daily. 08/09/23  Yes Elpidio Anis, PA-C  acetaminophen (TYLENOL) 500 MG tablet Take 500 mg by mouth every 6 (six) hours as needed.    [provider]  gabapentin (NEURONTIN) 300 MG capsule Take 1 capsule (300 mg total) by mouth 3 (three) times daily. 08/13/22 05/20/23  Edward Jolly, MD  oxyCODONE-acetaminophen (PERCOCET) 10-325 MG tablet Take 1 tablet by mouth every 6 (six) hours as needed for pain. Must last 30 days. 07/20/23 08/19/23  Edward Jolly, MD      Allergies    No known allergies    Review of Systems   Review of Systems  Respiratory:  Positive for cough.     Physical Exam Updated Vital Signs BP (!) 128/92   Pulse 82   Temp 98.5 F (36.9 C) (Oral)   Resp 17   Ht 5\' 4"  (1.626 m)   Wt 54.4 kg   SpO2 94%   BMI 20.60 kg/m  Physical Exam Vitals and nursing note reviewed.  Constitutional:      General: She is not in acute distress.    Appearance: Normal appearance. She is not toxic-appearing.  HENT:     Head: Normocephalic.     Mouth/Throat:     Mouth: Mucous membranes are moist.  Cardiovascular:     Rate and Rhythm: Normal rate and regular rhythm.     Heart sounds: No murmur  heard. Pulmonary:     Effort: Pulmonary effort is normal.     Breath sounds: Rhonchi (diffuse) present. No wheezing or rales.     Comments: Actively coughing during exam.  Chest:     Chest wall: No tenderness.  Abdominal:     General: There is no distension.     Palpations: Abdomen is soft.     Tenderness: There is no abdominal tenderness.  Musculoskeletal:        General: Normal range of motion.     Cervical back: Normal range of motion and neck supple.  Skin:    General: Skin is warm and dry.  Neurological:     Mental Status: She is alert and oriented to person, place, and time.     ED Results / Procedures / Treatments   Labs (all labs ordered are listed, but only abnormal results are displayed) Labs Reviewed  RESP PANEL BY RT-PCR (RSV, FLU A&B, COVID)  [id number]    EKG None  Radiology DG Chest 2 View  Result Date: 08/09/2023 CLINICAL DATA:  Cough, smoker EXAM: CHEST - 2 VIEW COMPARISON:  02/05/2021 FINDINGS: Faint increased interstitial markings, chronic. No focal consolidation. No pleural effusion or pneumothorax. The heart is normal in  size. Visualized osseous structures are within normal limits. IMPRESSION: Normal chest radiographs. Electronically Signed   By: Charline Bills M.D.   On: 08/09/2023 19:07    Procedures Procedures    Medications Ordered in ED Medications  doxycycline (VIBRA-TABS) tablet 100 mg (100 mg Oral Given 08/09/23 1903)    ED Course/ Medical Decision Making/ A&P Clinical Course as of 08/09/23 1915  Mon Aug 09, 2023  1858 Patient with productive cough for several days, generally unwell. Viral panel negative. She is a continuous smoker. Fever unknown with regular tylenol use. Will cover with abx, encourage PCP follow up.  [SU]    Clinical Course User Index [SU] Elpidio Anis, PA-C                                 Medical Decision Making Amount and/or Complexity of Data Reviewed Radiology: ordered.           Final Clinical  Impression(s) / ED Diagnoses Final diagnoses:  Bronchitis    Rx / DC Orders ED Discharge Orders          Ordered    doxycycline (VIBRAMYCIN) 100 MG capsule  2 times daily        08/09/23 1900              Elpidio Anis, PA-C 08/09/23 1915    Bethann Berkshire, MD 08/10/23 1029

## 2023-08-19 ENCOUNTER — Ambulatory Visit
Payer: Medicaid Other | Attending: Student in an Organized Health Care Education/Training Program | Admitting: Student in an Organized Health Care Education/Training Program

## 2023-08-19 ENCOUNTER — Encounter: Payer: Self-pay | Admitting: Student in an Organized Health Care Education/Training Program

## 2023-08-19 DIAGNOSIS — M961 Postlaminectomy syndrome, not elsewhere classified: Secondary | ICD-10-CM

## 2023-08-19 DIAGNOSIS — M25552 Pain in left hip: Secondary | ICD-10-CM | POA: Diagnosis not present

## 2023-08-19 DIAGNOSIS — T07XXXA Unspecified multiple injuries, initial encounter: Secondary | ICD-10-CM | POA: Diagnosis present

## 2023-08-19 DIAGNOSIS — M4726 Other spondylosis with radiculopathy, lumbar region: Secondary | ICD-10-CM | POA: Insufficient documentation

## 2023-08-19 DIAGNOSIS — T07XXXS Unspecified multiple injuries, sequela: Secondary | ICD-10-CM

## 2023-08-19 DIAGNOSIS — M5416 Radiculopathy, lumbar region: Secondary | ICD-10-CM

## 2023-08-19 DIAGNOSIS — Z9889 Other specified postprocedural states: Secondary | ICD-10-CM

## 2023-08-19 DIAGNOSIS — M25551 Pain in right hip: Secondary | ICD-10-CM

## 2023-08-19 DIAGNOSIS — G894 Chronic pain syndrome: Secondary | ICD-10-CM | POA: Diagnosis not present

## 2023-08-19 DIAGNOSIS — F17210 Nicotine dependence, cigarettes, uncomplicated: Secondary | ICD-10-CM | POA: Insufficient documentation

## 2023-08-19 MED ORDER — OXYCODONE-ACETAMINOPHEN 10-325 MG PO TABS
1.0000 | ORAL_TABLET | Freq: Four times a day (QID) | ORAL | 0 refills | Status: AC | PRN
Start: 2023-09-18 — End: 2023-10-18

## 2023-08-19 MED ORDER — OXYCODONE-ACETAMINOPHEN 10-325 MG PO TABS
1.0000 | ORAL_TABLET | Freq: Four times a day (QID) | ORAL | 0 refills | Status: DC | PRN
Start: 2023-10-18 — End: 2023-11-16

## 2023-08-19 MED ORDER — OXYCODONE-ACETAMINOPHEN 10-325 MG PO TABS
1.0000 | ORAL_TABLET | Freq: Four times a day (QID) | ORAL | 0 refills | Status: AC | PRN
Start: 2023-08-19 — End: 2023-09-18

## 2023-08-19 MED ORDER — GABAPENTIN 300 MG PO CAPS: 300.0000 mg | ORAL_CAPSULE | Freq: Three times a day (TID) | ORAL | 5 refills | Status: DC
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
## 2023-08-19 NOTE — Progress Notes (Signed)
Nursing Pain Medication Assessment:  Safety precautions to be maintained throughout the outpatient stay will include: orient to surroundings, keep bed in low position, maintain call bell within reach at all times, provide assistance with transfer out of bed and ambulation.  Medication Inspection Compliance: Pill count conducted under aseptic conditions, in front of the patient. Neither the pills nor the bottle was removed from the patient's sight at any time. Once count was completed pills were immediately returned to the patient in their original bottle.  Medication: Oxycodone/APAP Pill/Patch Count:  0 of 120 pills remain Pill/Patch Appearance: Markings consistent with prescribed medication Bottle Appearance: Standard pharmacy container. Clearly labeled. Filled Date: 73 / 15 / 2024 Last Medication intake:  Today

## 2023-11-16 ENCOUNTER — Ambulatory Visit
Payer: Medicaid Other | Attending: Student in an Organized Health Care Education/Training Program | Admitting: Student in an Organized Health Care Education/Training Program

## 2023-11-16 ENCOUNTER — Encounter: Payer: Self-pay | Admitting: Student in an Organized Health Care Education/Training Program

## 2023-11-16 DIAGNOSIS — M79604 Pain in right leg: Secondary | ICD-10-CM | POA: Insufficient documentation

## 2023-11-16 DIAGNOSIS — M79602 Pain in left arm: Secondary | ICD-10-CM | POA: Diagnosis not present

## 2023-11-16 DIAGNOSIS — M545 Low back pain, unspecified: Secondary | ICD-10-CM | POA: Diagnosis not present

## 2023-11-16 DIAGNOSIS — M961 Postlaminectomy syndrome, not elsewhere classified: Secondary | ICD-10-CM | POA: Diagnosis not present

## 2023-11-16 DIAGNOSIS — Z79899 Other long term (current) drug therapy: Secondary | ICD-10-CM | POA: Insufficient documentation

## 2023-11-16 DIAGNOSIS — Z9889 Other specified postprocedural states: Secondary | ICD-10-CM | POA: Insufficient documentation

## 2023-11-16 DIAGNOSIS — G894 Chronic pain syndrome: Secondary | ICD-10-CM | POA: Insufficient documentation

## 2023-11-16 DIAGNOSIS — T07XXXD Unspecified multiple injuries, subsequent encounter: Secondary | ICD-10-CM | POA: Insufficient documentation

## 2023-11-16 MED ORDER — OXYCODONE-ACETAMINOPHEN 10-325 MG PO TABS: 1.0000 | ORAL_TABLET | Freq: Four times a day (QID) | ORAL | 0 refills | Status: DC | PRN

## 2023-11-16 MED ORDER — OXYCODONE-ACETAMINOPHEN 10-325 MG PO TABS: 1.0000 | ORAL_TABLET | Freq: Four times a day (QID) | ORAL | 0 refills | Status: AC | PRN

## 2023-11-16 NOTE — Progress Notes (Signed)
Nursing Pain Medication Assessment:  Safety precautions to be maintained throughout the outpatient stay will include: orient to surroundings, keep bed in low position, maintain call bell within reach at all times, provide assistance with transfer out of bed and ambulation.  Medication Inspection Compliance: Pill count conducted under aseptic conditions, in front of the patient. Neither the pills nor the bottle was removed from the patient's sight at any time. Once count was completed pills were immediately returned to the patient in their original bottle.  Medication: Oxycodone/APAP Pill/Patch Count: 3/130 Pill/Patch Appearance: Markings consistent with prescribed medication Bottle Appearance: Standard pharmacy container. Clearly labeled. Filled Date: 01 / 13 / 2025 Last Medication intake:  Today
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
## 2023-11-16 NOTE — Patient Instructions (Signed)

## 2024-01-12 ENCOUNTER — Telehealth: Payer: Self-pay | Admitting: Student in an Organized Health Care Education/Training Program

## 2024-01-12 NOTE — Telephone Encounter (Signed)
 PT stated that she need a PA so she can get refill on Oxycodone. PT stated that she spoke with insurance and they inform her that they had fax over a request. PT stated that she is suppose to pick up medication on Saturday. PT stated that if you have any questions please give her a call. TY

## 2024-01-12 NOTE — Telephone Encounter (Signed)
 PA done

## 2024-02-07 NOTE — Progress Notes (Unsigned)
 PROVIDER NOTE: Interpretation of information contained herein should be left to medically-trained personnel. Specific patient instructions are provided elsewhere under "Patient Instructions" section of medical record. This document was created in part using AI and STT-dictation technology, any transcriptional errors that may result from this process are unintentional.  Patient: Dana Goodwin  Service: E/M   PCP: Dana Galen, MD  DOB: 1961-02-27  DOS: 02/08/2024  Provider: Cherylin Corrigan, NP  MRN: [id number]  Delivery: Face-to-face  Specialty: Interventional Pain Management  Type: Established Patient  Setting: Ambulatory outpatient facility  Specialty designation: 09  Referring Prov.: Dana Galen, MD  Location: Outpatient office facility       HPI  [name], a 63 y.o. year old female, is here today because of her No primary diagnosis found.. Dana Goodwin primary complain today is No chief complaint on file.  Pertinent problems: Dana Goodwin does not have any pertinent problems on file. Pain Assessment: Severity of   is reported as a  /10. Location:    / . Onset:  . Quality:  . Timing:  . Modifying factor(s):  Aaron Aas Vitals:  vitals were not taken for this visit.  BMI: Estimated body mass index is 20.25 kg/m as calculated from the following:   Height as of 11/16/23: 5\' 4"  (1.626 m).   Weight as of 11/16/23: 118 lb (53.5 kg). Last encounter: Visit date not found. Last procedure: Visit date not found.  Reason for encounter: medication management. No change in medical history since last visit.  Patient's pain is at baseline.  Patient continues multimodal pain regimen as prescribed.  States that it provides pain relief and improvement in functional status.   Pharmacotherapy Assessment  Analgesic: {There is no content from the last Subjective section.}   Monitoring: [place] PMP: PDMP not reviewed this encounter.       Pharmacotherapy: No side-effects or adverse reactions reported. Compliance: No  problems identified. Effectiveness: Clinically acceptable.  No notes on file  No results found for: "CBDTHCR" No results found for: "D8THCCBX" No results found for: "D9THCCBX"  UDS:  Summary  Date Value Ref Range Status  05/20/2023 Note  Final    Comment:    ==================================================================== [facility] (MW) ==================================================================== Test                             Result       Flag       Units  Drug Present and Declared for Prescription Verification   Oxycodone                       4102         EXPECTED   ng/mg creat   Oxymorphone                    5370         EXPECTED   ng/mg creat   Noroxycodone                   10208        EXPECTED   ng/mg creat   Noroxymorphone                 3462         EXPECTED   ng/mg creat    Sources of oxycodone  are scheduled prescription medications.    Oxymorphone, noroxycodone, and noroxymorphone are expected    metabolites of oxycodone .  Oxymorphone is also available as a    scheduled prescription medication.  ==================================================================== Test                      Result    Flag   Units      Ref Range   Creatinine              53               mg/dL      >=84 ==================================================================== Declared Medications:  The flagging and interpretation on this report are based on the  following declared medications.  Unexpected results may arise from  inaccuracies in the declared medications.   **Note: The testing scope of this panel includes these medications:   Oxycodone  (Percocet)   **Note: The testing scope of this panel does not include the  following reported medications:   Acetaminophen  (Tylenol )  Acetaminophen  (Percocet)  Gabapentin  (Neurontin ) ==================================================================== For clinical consultation, please call [phone]. ====================================================================       ROS  Constitutional: Denies any fever or chills Gastrointestinal: No reported hemesis, hematochezia, vomiting, or acute GI distress Musculoskeletal: Denies any acute onset joint swelling, redness, loss of ROM, or weakness Neurological: No reported episodes of acute onset apraxia, aphasia, dysarthria, agnosia, amnesia, paralysis, loss of coordination, or loss of consciousness  Medication Review  acetaminophen , doxycycline , gabapentin , and oxyCODONE -acetaminophen   History Review  Allergy: Dana Goodwin is allergic to no known allergies. Drug: Dana Goodwin  reports no history of drug use. Alcohol:  reports no history of alcohol use. Tobacco:  reports that she quit smoking about 3 years ago. Her smoking use included cigarettes. She started smoking about 40 years ago. She has a 18.5 pack-year smoking history. She has never used smokeless tobacco. Social: Dana Goodwin  reports that she quit smoking about 3 years ago. Her smoking use included cigarettes. She started smoking about 40 years ago. She has a 18.5 pack-year smoking history. She has never used smokeless tobacco. She reports that she does not drink alcohol and does not use drugs. Medical:  has a past medical history of Chronic back pain, History of colon polyps, Numbness, Sciatica, and Urinary frequency. Surgical: Dana Goodwin  has a past surgical history that includes Colonoscopy (N/A, 06/19/2016); Back surgery; Fracture surgery (Left); and Lumbar laminectomy/decompression microdiscectomy (Left, 12/30/2016). Family: family history includes Diabetes in her brother; Heart attack in her brother and mother; Prostate cancer in her father.  Laboratory Chemistry Profile   Renal Lab Results  Component Value Date   BUN 15 02/05/2021   CREATININE 0.55 02/05/2021   GFRAA >60 10/20/2017   GFRNONAA >60 02/05/2021    Hepatic No results found for: "AST", "ALT",  "ALBUMIN", "ALKPHOS", "HCVAB", "AMYLASE", "LIPASE", "AMMONIA"  Electrolytes Lab Results  Component Value Date   NA 136 02/05/2021   K 3.9 02/05/2021   CL 101 02/05/2021   CALCIUM 9.2 02/05/2021    Bone No results found for: "VD25OH", "VD125OH2TOT", "WU1324MW1", "UU7253GU4", "25OHVITD1", "25OHVITD2", "25OHVITD3", "TESTOFREE", "TESTOSTERONE"  Inflammation (CRP: Acute Phase) (ESR: Chronic Phase) No results found for: "CRP", "ESRSEDRATE", "LATICACIDVEN"       Note: Above Lab results reviewed.  Recent Imaging Review  DG Chest 2 View CLINICAL DATA:  Cough, smoker  EXAM: CHEST - 2 VIEW  COMPARISON:  02/05/2021  FINDINGS: Faint increased interstitial markings, chronic. No focal consolidation. No pleural effusion or pneumothorax.  The heart is normal in size.  Visualized osseous structures are within normal limits.  IMPRESSION: Normal  chest radiographs.  Electronically Signed   By: Zadie Herter M.D.   On: 08/09/2023 19:07 Note: Reviewed        Physical Exam  General appearance: Well nourished, well developed, and well hydrated. In no apparent acute distress Mental status: Alert, oriented x 3 (person, place, & time)       Respiratory: No evidence of acute respiratory distress Eyes: PERLA Vitals: There were no vitals taken for this visit. BMI: Estimated body mass index is 20.25 kg/m as calculated from the following:   Height as of 11/16/23: 5\' 4"  (1.626 m).   Weight as of 11/16/23: 118 lb (53.5 kg). Ideal: Patient weight not recorded  Assessment   Diagnosis Status  No diagnosis found. Controlled Controlled Controlled   Updated Problems: No problems updated.  Plan of Care  Problem-specific:  Assessment and Plan            Dana Goodwin has a current medication list which includes the following long-term medication(s): gabapentin .  Pharmacotherapy (Medications Ordered): No orders of the defined types were placed in this encounter.  Orders:  No  orders of the defined types were placed in this encounter.  Follow-up plan:   No follow-ups on file.     {There is no content from the last Plan section.}   Recent Visits Date Type Provider Dept  11/16/23 Office Visit Cephus Collin, MD [facility]  Showing recent visits within past 90 days and meeting all other requirements Future Appointments Date Type Provider Dept  02/08/24 Appointment Khloe Hunkele K, NP [facility]  Showing future appointments within next 90 days and meeting all other requirements  I discussed the assessment and treatment plan with the patient. The patient was provided an opportunity to ask questions and all were answered. The patient agreed with the plan and demonstrated an understanding of the instructions.  Patient advised to call back or seek an in-person evaluation if the symptoms or condition worsens.  Duration of encounter: *** minutes.  Total time on encounter, as per AMA guidelines included both the face-to-face and non-face-to-face time personally spent by the physician and/or other qualified health care professional(s) on the day of the encounter (includes time in activities that require the physician or other qualified health care professional and does not include time in activities normally performed by clinical staff). Physician's time may include the following activities when performed: Preparing to see the patient (e.g., pre-charting review of records, searching for previously ordered imaging, lab work, and nerve conduction tests) Review of prior analgesic pharmacotherapies. Reviewing PMP Interpreting ordered tests (e.g., lab work, imaging, nerve conduction tests) Performing post-procedure evaluations, including interpretation of diagnostic procedures Obtaining and/or reviewing separately obtained history Performing a medically appropriate examination and/or evaluation Counseling and educating the patient/family/caregiver Ordering  medications, tests, or procedures Referring and communicating with other health care professionals (when not separately reported) Documenting clinical information in the electronic or other health record Independently interpreting results (not separately reported) and communicating results to the patient/ family/caregiver Care coordination (not separately reported)  Note by: Perley Arthurs K Garrie Woodin, NP (TTS and AI technology used. I apologize for any typographical errors that were not detected and corrected.) Date: 02/08/2024; Time: 3:04 PM

## 2024-02-08 ENCOUNTER — Ambulatory Visit
Payer: Medicaid Other | Attending: Student in an Organized Health Care Education/Training Program | Admitting: Nurse Practitioner

## 2024-02-08 ENCOUNTER — Encounter: Payer: Self-pay | Admitting: Nurse Practitioner

## 2024-02-08 DIAGNOSIS — M79604 Pain in right leg: Secondary | ICD-10-CM

## 2024-02-08 DIAGNOSIS — X58XXXA Exposure to other specified factors, initial encounter: Secondary | ICD-10-CM | POA: Diagnosis not present

## 2024-02-08 DIAGNOSIS — Z9889 Other specified postprocedural states: Secondary | ICD-10-CM | POA: Insufficient documentation

## 2024-02-08 DIAGNOSIS — Z79899 Other long term (current) drug therapy: Secondary | ICD-10-CM | POA: Insufficient documentation

## 2024-02-08 DIAGNOSIS — T07XXXA Unspecified multiple injuries, initial encounter: Secondary | ICD-10-CM | POA: Diagnosis not present

## 2024-02-08 DIAGNOSIS — M47816 Spondylosis without myelopathy or radiculopathy, lumbar region: Secondary | ICD-10-CM | POA: Diagnosis not present

## 2024-02-08 DIAGNOSIS — M5416 Radiculopathy, lumbar region: Secondary | ICD-10-CM | POA: Insufficient documentation

## 2024-02-08 DIAGNOSIS — M25552 Pain in left hip: Secondary | ICD-10-CM | POA: Diagnosis not present

## 2024-02-08 DIAGNOSIS — R079 Chest pain, unspecified: Secondary | ICD-10-CM | POA: Diagnosis not present

## 2024-02-08 DIAGNOSIS — M25551 Pain in right hip: Secondary | ICD-10-CM | POA: Diagnosis not present

## 2024-02-08 DIAGNOSIS — M961 Postlaminectomy syndrome, not elsewhere classified: Secondary | ICD-10-CM | POA: Diagnosis not present

## 2024-02-08 DIAGNOSIS — G894 Chronic pain syndrome: Secondary | ICD-10-CM | POA: Diagnosis not present

## 2024-02-08 DIAGNOSIS — G8929 Other chronic pain: Secondary | ICD-10-CM | POA: Insufficient documentation

## 2024-02-08 MED ORDER — GABAPENTIN 300 MG PO CAPS: 300.0000 mg | ORAL_CAPSULE | Freq: Three times a day (TID) | ORAL | 5 refills | Status: DC

## 2024-02-08 MED ORDER — OXYCODONE-ACETAMINOPHEN 10-325 MG PO TABS: 1.0000 | ORAL_TABLET | Freq: Four times a day (QID) | ORAL | 0 refills | Status: AC | PRN

## 2024-02-08 MED ORDER — OXYCODONE-ACETAMINOPHEN 10-325 MG PO TABS: 1.0000 | ORAL_TABLET | Freq: Four times a day (QID) | ORAL | 0 refills | Status: DC | PRN

## 2024-02-08 NOTE — Progress Notes (Signed)
 Safety precautions to be maintained throughout the outpatient stay will include: orient to surroundings, keep bed in low position, maintain call bell within reach at all times, provide assistance with transfer out of bed and ambulation.   Nursing Pain Medication Assessment:  Safety precautions to be maintained throughout the outpatient stay will include: orient to surroundings, keep bed in low position, maintain call bell within reach at all times, provide assistance with transfer out of bed and ambulation.  Medication Inspection Compliance: Pill count conducted under aseptic conditions, in front of the patient. Neither the pills nor the bottle was removed from the patient's sight at any time. Once count was completed pills were immediately returned to the patient in their original bottle.  Medication: Oxycodone /APAP Pill/Patch Count:  26 of 120 pills remain Pill/Patch Appearance: Markings consistent with prescribed medication Bottle Appearance: Standard pharmacy container. Clearly labeled. Filled Date: 4 / 72 / 2025 Last Medication intake:  Today

## 2024-05-10 ENCOUNTER — Encounter: Payer: Self-pay | Admitting: Nurse Practitioner

## 2024-05-10 ENCOUNTER — Ambulatory Visit: Attending: Nurse Practitioner | Admitting: Nurse Practitioner

## 2024-05-10 VITALS — BP 115/64 | HR 57 | Temp 97.9°F | Ht 64.0 in | Wt 118.0 lb

## 2024-05-10 DIAGNOSIS — M25552 Pain in left hip: Secondary | ICD-10-CM | POA: Insufficient documentation

## 2024-05-10 DIAGNOSIS — M79604 Pain in right leg: Secondary | ICD-10-CM | POA: Insufficient documentation

## 2024-05-10 DIAGNOSIS — B0229 Other postherpetic nervous system involvement: Secondary | ICD-10-CM | POA: Diagnosis not present

## 2024-05-10 DIAGNOSIS — M961 Postlaminectomy syndrome, not elsewhere classified: Secondary | ICD-10-CM | POA: Diagnosis not present

## 2024-05-10 DIAGNOSIS — T07XXXA Unspecified multiple injuries, initial encounter: Secondary | ICD-10-CM

## 2024-05-10 DIAGNOSIS — M4726 Other spondylosis with radiculopathy, lumbar region: Secondary | ICD-10-CM | POA: Insufficient documentation

## 2024-05-10 DIAGNOSIS — Z79899 Other long term (current) drug therapy: Secondary | ICD-10-CM | POA: Insufficient documentation

## 2024-05-10 DIAGNOSIS — T07XXXS Unspecified multiple injuries, sequela: Secondary | ICD-10-CM | POA: Insufficient documentation

## 2024-05-10 DIAGNOSIS — M5416 Radiculopathy, lumbar region: Secondary | ICD-10-CM | POA: Diagnosis present

## 2024-05-10 DIAGNOSIS — M545 Low back pain, unspecified: Secondary | ICD-10-CM | POA: Insufficient documentation

## 2024-05-10 DIAGNOSIS — M25551 Pain in right hip: Secondary | ICD-10-CM | POA: Insufficient documentation

## 2024-05-10 DIAGNOSIS — Z9889 Other specified postprocedural states: Secondary | ICD-10-CM | POA: Insufficient documentation

## 2024-05-10 DIAGNOSIS — M25512 Pain in left shoulder: Secondary | ICD-10-CM | POA: Diagnosis present

## 2024-05-10 DIAGNOSIS — G894 Chronic pain syndrome: Secondary | ICD-10-CM | POA: Insufficient documentation

## 2024-05-10 DIAGNOSIS — Z79891 Long term (current) use of opiate analgesic: Secondary | ICD-10-CM | POA: Diagnosis not present

## 2024-05-10 DIAGNOSIS — M47816 Spondylosis without myelopathy or radiculopathy, lumbar region: Secondary | ICD-10-CM

## 2024-05-10 MED ORDER — OXYCODONE-ACETAMINOPHEN 10-325 MG PO TABS: 1.0000 | ORAL_TABLET | Freq: Four times a day (QID) | ORAL | 0 refills | Status: AC | PRN

## 2024-05-10 MED ORDER — OXYCODONE-ACETAMINOPHEN 10-325 MG PO TABS: 1.0000 | ORAL_TABLET | Freq: Four times a day (QID) | ORAL | 0 refills | Status: DC | PRN
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
## 2024-05-10 NOTE — Progress Notes (Signed)
 Safety precautions to be maintained throughout the outpatient stay will include: orient to surroundings, keep bed in low position, maintain call bell within reach at all times, provide assistance with transfer out of bed and ambulation.   Nursing Pain Medication Assessment:  Safety precautions to be maintained throughout the outpatient stay will include: orient to surroundings, keep bed in low position, maintain call bell within reach at all times, provide assistance with transfer out of bed and ambulation.  Medication Inspection Compliance: Pill count conducted under aseptic conditions, in front of the patient. Neither the pills nor the bottle was removed from the patient's sight at any time. Once count was completed pills were immediately returned to the patient in their original bottle.  Medication: Oxycodone /APAP Pill/Patch Count: 14 of 120 pills/patches remain Pill/Patch Appearance: Markings consistent with prescribed medication Bottle Appearance: Standard pharmacy container. Clearly labeled. Filled Date: 7 / 28 / 2025 Last Medication intake:  Today

## 2024-05-12 LAB — TOXASSURE SELECT 13 (MW), URINE

## 2024-08-09 LAB — COLOGUARD: COLOGUARD: NEGATIVE

## 2024-08-10 ENCOUNTER — Ambulatory Visit: Attending: Nurse Practitioner | Admitting: Nurse Practitioner

## 2024-08-10 ENCOUNTER — Encounter: Payer: Self-pay | Admitting: Nurse Practitioner

## 2024-08-10 VITALS — BP 105/61 | HR 82 | Temp 97.3°F | Resp 18 | Ht 64.0 in | Wt 119.0 lb

## 2024-08-10 DIAGNOSIS — G894 Chronic pain syndrome: Secondary | ICD-10-CM | POA: Insufficient documentation

## 2024-08-10 DIAGNOSIS — Z79899 Other long term (current) drug therapy: Secondary | ICD-10-CM | POA: Diagnosis not present

## 2024-08-10 DIAGNOSIS — M5416 Radiculopathy, lumbar region: Secondary | ICD-10-CM | POA: Diagnosis not present

## 2024-08-10 DIAGNOSIS — M25552 Pain in left hip: Secondary | ICD-10-CM | POA: Insufficient documentation

## 2024-08-10 DIAGNOSIS — Z9889 Other specified postprocedural states: Secondary | ICD-10-CM | POA: Diagnosis present

## 2024-08-10 DIAGNOSIS — Z87891 Personal history of nicotine dependence: Secondary | ICD-10-CM | POA: Diagnosis not present

## 2024-08-10 DIAGNOSIS — M25551 Pain in right hip: Secondary | ICD-10-CM | POA: Insufficient documentation

## 2024-08-10 DIAGNOSIS — R400 Somnolence: Secondary | ICD-10-CM | POA: Insufficient documentation

## 2024-08-10 DIAGNOSIS — Z7409 Other reduced mobility: Secondary | ICD-10-CM | POA: Insufficient documentation

## 2024-08-10 DIAGNOSIS — T07XXXA Unspecified multiple injuries, initial encounter: Secondary | ICD-10-CM | POA: Insufficient documentation

## 2024-08-10 DIAGNOSIS — R079 Chest pain, unspecified: Secondary | ICD-10-CM | POA: Diagnosis not present

## 2024-08-10 DIAGNOSIS — M961 Postlaminectomy syndrome, not elsewhere classified: Secondary | ICD-10-CM | POA: Diagnosis not present

## 2024-08-10 MED ORDER — GABAPENTIN 300 MG PO CAPS: 300.0000 mg | ORAL_CAPSULE | Freq: Three times a day (TID) | ORAL | 5 refills | Status: AC

## 2024-08-10 MED ORDER — OXYCODONE-ACETAMINOPHEN 10-325 MG PO TABS: 1.0000 | ORAL_TABLET | Freq: Four times a day (QID) | ORAL | 0 refills | Status: AC | PRN

## 2024-08-10 MED ORDER — OXYCODONE-ACETAMINOPHEN 10-325 MG PO TABS: 1.0000 | ORAL_TABLET | Freq: Four times a day (QID) | ORAL | 0 refills | Status: DC | PRN
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
## 2024-08-10 NOTE — Progress Notes (Signed)
 Nursing Pain Medication Assessment:  Safety precautions to be maintained throughout the outpatient stay will include: orient to surroundings, keep bed in low position, maintain call bell within reach at all times, provide assistance with transfer out of bed and ambulation.  Medication Inspection Compliance: Pill count conducted under aseptic conditions, in front of the patient. Neither the pills nor the bottle was removed from the patient's sight at any time. Once count was completed pills were immediately returned to the patient in their original bottle.  Medication: Oxycodone /APAP Pill/Patch Count: 06 of 120 pills/patches remain Pill/Patch Appearance: Markings consistent with prescribed medication Bottle Appearance: Standard pharmacy container. Clearly labeled. Filled Date: 10 / 09 / 2025 Last Medication intake:  Today

## 2024-11-07 ENCOUNTER — Encounter: Payer: Self-pay | Admitting: Nurse Practitioner

## 2024-11-07 ENCOUNTER — Ambulatory Visit: Admitting: Nurse Practitioner

## 2024-11-07 VITALS — BP 134/74 | HR 65 | Temp 97.6°F | Resp 16 | Ht 64.0 in | Wt 119.0 lb

## 2024-11-07 DIAGNOSIS — M25551 Pain in right hip: Secondary | ICD-10-CM | POA: Diagnosis not present

## 2024-11-07 DIAGNOSIS — M47816 Spondylosis without myelopathy or radiculopathy, lumbar region: Secondary | ICD-10-CM

## 2024-11-07 DIAGNOSIS — M5126 Other intervertebral disc displacement, lumbar region: Secondary | ICD-10-CM | POA: Diagnosis not present

## 2024-11-07 DIAGNOSIS — M5416 Radiculopathy, lumbar region: Secondary | ICD-10-CM | POA: Diagnosis not present

## 2024-11-07 DIAGNOSIS — M961 Postlaminectomy syndrome, not elsewhere classified: Secondary | ICD-10-CM | POA: Diagnosis not present

## 2024-11-07 DIAGNOSIS — T07XXXA Unspecified multiple injuries, initial encounter: Secondary | ICD-10-CM

## 2024-11-07 DIAGNOSIS — M25552 Pain in left hip: Secondary | ICD-10-CM | POA: Diagnosis not present

## 2024-11-07 DIAGNOSIS — G894 Chronic pain syndrome: Secondary | ICD-10-CM

## 2024-11-07 DIAGNOSIS — Z79899 Other long term (current) drug therapy: Secondary | ICD-10-CM

## 2024-11-07 DIAGNOSIS — Z9889 Other specified postprocedural states: Secondary | ICD-10-CM

## 2024-11-07 DIAGNOSIS — M792 Neuralgia and neuritis, unspecified: Secondary | ICD-10-CM

## 2024-11-07 MED ORDER — OXYCODONE-ACETAMINOPHEN 10-325 MG PO TABS: 1.0000 | ORAL_TABLET | Freq: Four times a day (QID) | ORAL | 0 refills | Status: AC | PRN

## 2024-11-07 NOTE — Progress Notes (Signed)
 Nursing Pain Medication Assessment:  Safety precautions to be maintained throughout the outpatient stay will include: orient to surroundings, keep bed in low position, maintain call bell within reach at all times, provide assistance with transfer out of bed and ambulation.  Medication Inspection Compliance: Pill count conducted under aseptic conditions, in front of the patient. Neither the pills nor the bottle was removed from the patient's sight at any time. Once count was completed pills were immediately returned to the patient in their original bottle.  Medication: Oxycodone /APAP Pill/Patch Count: 10 of 120 pills/patches remain Pill/Patch Appearance: Markings consistent with prescribed medication Bottle Appearance: Standard pharmacy container. Clearly labeled. Filled Date: 01 / 07 / 2026 Last Medication intake:  Today

## 2024-11-07 NOTE — Patient Instructions (Signed)

## 2025-01-30 ENCOUNTER — Encounter: Admitting: Nurse Practitioner
